# Patient Record
Sex: Female | Born: 1940 | Race: White | Hispanic: No | Marital: Married | Smoking: Never smoker
Health system: Southern US, Community
[De-identification: ages and names within clinical notes are randomized; demographics above are authoritative.]

## PROBLEM LIST (undated history)

## (undated) DIAGNOSIS — I10 Essential (primary) hypertension: Secondary | ICD-10-CM

## (undated) DIAGNOSIS — E039 Hypothyroidism, unspecified: Secondary | ICD-10-CM

## (undated) DIAGNOSIS — M51369 Other intervertebral disc degeneration, lumbar region without mention of lumbar back pain or lower extremity pain: Secondary | ICD-10-CM

## (undated) DIAGNOSIS — M5136 Other intervertebral disc degeneration, lumbar region: Secondary | ICD-10-CM

## (undated) DIAGNOSIS — G8929 Other chronic pain: Secondary | ICD-10-CM

## (undated) DIAGNOSIS — Z86718 Personal history of other venous thrombosis and embolism: Secondary | ICD-10-CM

## (undated) DIAGNOSIS — M810 Age-related osteoporosis without current pathological fracture: Secondary | ICD-10-CM

## (undated) HISTORY — PX: BACK SURGERY: SHX140

## (undated) HISTORY — DX: Hypothyroidism, unspecified: E03.9

## (undated) HISTORY — DX: Age-related osteoporosis without current pathological fracture: M81.0

## (undated) HISTORY — PX: THROMBECTOMY W/ EMBOLECTOMY: SHX2507

## (undated) HISTORY — PX: OTHER SURGICAL HISTORY: SHX169

---

## 1999-01-27 ENCOUNTER — Other Ambulatory Visit: Admission: RE | Admit: 1999-01-27 | Discharge: 1999-01-27 | Payer: Self-pay | Admitting: Internal Medicine

## 1999-06-22 ENCOUNTER — Ambulatory Visit (HOSPITAL_COMMUNITY): Admission: RE | Admit: 1999-06-22 | Discharge: 1999-06-22 | Payer: Self-pay | Admitting: Gastroenterology

## 1999-06-22 ENCOUNTER — Encounter: Payer: Self-pay | Admitting: Gastroenterology

## 1999-07-29 ENCOUNTER — Encounter: Admission: RE | Admit: 1999-07-29 | Discharge: 1999-07-29 | Payer: Self-pay | Admitting: *Deleted

## 1999-07-29 ENCOUNTER — Encounter: Payer: Self-pay | Admitting: *Deleted

## 1999-08-03 ENCOUNTER — Inpatient Hospital Stay (HOSPITAL_COMMUNITY): Admission: EM | Admit: 1999-08-03 | Discharge: 1999-08-07 | Payer: Self-pay | Admitting: Emergency Medicine

## 1999-08-03 ENCOUNTER — Encounter: Payer: Self-pay | Admitting: Internal Medicine

## 1999-08-03 ENCOUNTER — Encounter: Payer: Self-pay | Admitting: Emergency Medicine

## 1999-09-03 ENCOUNTER — Encounter: Admission: RE | Admit: 1999-09-03 | Discharge: 1999-09-03 | Payer: Self-pay | Admitting: *Deleted

## 1999-09-03 ENCOUNTER — Encounter: Payer: Self-pay | Admitting: *Deleted

## 2000-03-31 ENCOUNTER — Encounter: Admission: RE | Admit: 2000-03-31 | Discharge: 2000-03-31 | Payer: Self-pay | Admitting: Internal Medicine

## 2000-03-31 ENCOUNTER — Encounter: Payer: Self-pay | Admitting: Internal Medicine

## 2000-11-16 ENCOUNTER — Encounter: Payer: Self-pay | Admitting: Internal Medicine

## 2000-11-16 ENCOUNTER — Encounter: Admission: RE | Admit: 2000-11-16 | Discharge: 2000-11-16 | Payer: Self-pay | Admitting: Internal Medicine

## 2000-11-17 ENCOUNTER — Encounter: Admission: RE | Admit: 2000-11-17 | Discharge: 2000-11-17 | Payer: Self-pay | Admitting: Internal Medicine

## 2000-11-17 ENCOUNTER — Encounter: Payer: Self-pay | Admitting: Internal Medicine

## 2001-01-08 ENCOUNTER — Encounter (INDEPENDENT_AMBULATORY_CARE_PROVIDER_SITE_OTHER): Payer: Self-pay

## 2001-01-08 ENCOUNTER — Other Ambulatory Visit: Admission: RE | Admit: 2001-01-08 | Discharge: 2001-01-08 | Payer: Self-pay | Admitting: General Surgery

## 2002-05-14 ENCOUNTER — Encounter: Admission: RE | Admit: 2002-05-14 | Discharge: 2002-05-14 | Payer: Self-pay | Admitting: Internal Medicine

## 2002-05-14 ENCOUNTER — Encounter: Payer: Self-pay | Admitting: Internal Medicine

## 2002-05-29 ENCOUNTER — Encounter: Payer: Self-pay | Admitting: Internal Medicine

## 2002-05-29 ENCOUNTER — Ambulatory Visit (HOSPITAL_COMMUNITY): Admission: RE | Admit: 2002-05-29 | Discharge: 2002-05-29 | Payer: Self-pay | Admitting: Internal Medicine

## 2002-06-10 ENCOUNTER — Encounter: Payer: Self-pay | Admitting: Internal Medicine

## 2002-06-10 ENCOUNTER — Ambulatory Visit (HOSPITAL_COMMUNITY): Admission: RE | Admit: 2002-06-10 | Discharge: 2002-06-10 | Payer: Self-pay | Admitting: Internal Medicine

## 2002-06-24 ENCOUNTER — Ambulatory Visit (HOSPITAL_COMMUNITY): Admission: RE | Admit: 2002-06-24 | Discharge: 2002-06-24 | Payer: Self-pay | Admitting: Internal Medicine

## 2002-06-24 ENCOUNTER — Encounter (INDEPENDENT_AMBULATORY_CARE_PROVIDER_SITE_OTHER): Payer: Self-pay | Admitting: Specialist

## 2002-06-24 ENCOUNTER — Encounter: Payer: Self-pay | Admitting: Internal Medicine

## 2002-07-22 ENCOUNTER — Encounter: Payer: Self-pay | Admitting: General Surgery

## 2002-07-24 ENCOUNTER — Encounter (INDEPENDENT_AMBULATORY_CARE_PROVIDER_SITE_OTHER): Payer: Self-pay | Admitting: *Deleted

## 2002-07-24 ENCOUNTER — Ambulatory Visit (HOSPITAL_COMMUNITY): Admission: RE | Admit: 2002-07-24 | Discharge: 2002-07-25 | Payer: Self-pay | Admitting: General Surgery

## 2003-07-29 ENCOUNTER — Other Ambulatory Visit: Admission: RE | Admit: 2003-07-29 | Discharge: 2003-07-29 | Payer: Self-pay | Admitting: Obstetrics and Gynecology

## 2003-12-18 ENCOUNTER — Encounter: Admission: RE | Admit: 2003-12-18 | Discharge: 2003-12-18 | Payer: Self-pay | Admitting: Internal Medicine

## 2004-05-31 ENCOUNTER — Encounter: Admission: RE | Admit: 2004-05-31 | Discharge: 2004-05-31 | Payer: Self-pay | Admitting: Internal Medicine

## 2004-09-06 ENCOUNTER — Ambulatory Visit (HOSPITAL_BASED_OUTPATIENT_CLINIC_OR_DEPARTMENT_OTHER): Admission: RE | Admit: 2004-09-06 | Discharge: 2004-09-06 | Payer: Self-pay | Admitting: Otolaryngology

## 2004-09-06 ENCOUNTER — Encounter (INDEPENDENT_AMBULATORY_CARE_PROVIDER_SITE_OTHER): Payer: Self-pay | Admitting: Specialist

## 2004-09-06 ENCOUNTER — Ambulatory Visit (HOSPITAL_COMMUNITY): Admission: RE | Admit: 2004-09-06 | Discharge: 2004-09-06 | Payer: Self-pay | Admitting: Otolaryngology

## 2004-09-10 ENCOUNTER — Emergency Department (HOSPITAL_COMMUNITY): Admission: EM | Admit: 2004-09-10 | Discharge: 2004-09-10 | Payer: Self-pay

## 2004-12-14 ENCOUNTER — Other Ambulatory Visit: Admission: RE | Admit: 2004-12-14 | Discharge: 2004-12-14 | Payer: Self-pay | Admitting: Obstetrics and Gynecology

## 2005-10-31 ENCOUNTER — Encounter: Admission: RE | Admit: 2005-10-31 | Discharge: 2005-10-31 | Payer: Self-pay | Admitting: Obstetrics and Gynecology

## 2006-12-19 ENCOUNTER — Encounter: Admission: RE | Admit: 2006-12-19 | Discharge: 2006-12-19 | Payer: Self-pay | Admitting: Internal Medicine

## 2007-01-17 ENCOUNTER — Encounter: Admission: RE | Admit: 2007-01-17 | Discharge: 2007-01-17 | Payer: Self-pay | Admitting: Internal Medicine

## 2007-09-04 ENCOUNTER — Other Ambulatory Visit: Admission: RE | Admit: 2007-09-04 | Discharge: 2007-09-04 | Payer: Self-pay | Admitting: Obstetrics and Gynecology

## 2007-10-29 ENCOUNTER — Encounter: Admission: RE | Admit: 2007-10-29 | Discharge: 2007-10-29 | Payer: Self-pay | Admitting: Obstetrics and Gynecology

## 2008-08-13 ENCOUNTER — Emergency Department (HOSPITAL_COMMUNITY): Admission: EM | Admit: 2008-08-13 | Discharge: 2008-08-13 | Payer: Self-pay | Admitting: Emergency Medicine

## 2008-08-14 ENCOUNTER — Inpatient Hospital Stay (HOSPITAL_COMMUNITY): Admission: EM | Admit: 2008-08-14 | Discharge: 2008-08-22 | Payer: Self-pay | Admitting: Emergency Medicine

## 2008-08-27 ENCOUNTER — Inpatient Hospital Stay (HOSPITAL_COMMUNITY): Admission: AD | Admit: 2008-08-27 | Discharge: 2008-09-12 | Payer: Self-pay | Admitting: Neurosurgery

## 2008-08-29 ENCOUNTER — Ambulatory Visit: Payer: Self-pay | Admitting: Surgery

## 2008-08-29 ENCOUNTER — Encounter (INDEPENDENT_AMBULATORY_CARE_PROVIDER_SITE_OTHER): Payer: Self-pay | Admitting: Neurosurgery

## 2008-09-29 ENCOUNTER — Inpatient Hospital Stay (HOSPITAL_COMMUNITY): Admission: EM | Admit: 2008-09-29 | Discharge: 2008-10-20 | Payer: Self-pay | Admitting: Emergency Medicine

## 2008-10-10 ENCOUNTER — Encounter (INDEPENDENT_AMBULATORY_CARE_PROVIDER_SITE_OTHER): Payer: Self-pay | Admitting: Neurosurgery

## 2008-10-14 ENCOUNTER — Ambulatory Visit: Payer: Self-pay | Admitting: Physical Medicine & Rehabilitation

## 2008-10-16 ENCOUNTER — Ambulatory Visit: Payer: Self-pay | Admitting: Surgery

## 2008-10-16 ENCOUNTER — Encounter (INDEPENDENT_AMBULATORY_CARE_PROVIDER_SITE_OTHER): Payer: Self-pay | Admitting: Neurosurgery

## 2008-11-21 ENCOUNTER — Emergency Department (HOSPITAL_COMMUNITY): Admission: EM | Admit: 2008-11-21 | Discharge: 2008-11-22 | Payer: Self-pay | Admitting: Emergency Medicine

## 2008-11-21 ENCOUNTER — Ambulatory Visit: Payer: Self-pay | Admitting: Internal Medicine

## 2008-12-29 ENCOUNTER — Encounter: Admission: RE | Admit: 2008-12-29 | Discharge: 2009-01-12 | Payer: Self-pay | Admitting: Neurosurgery

## 2009-01-21 IMAGING — CR DG THORACIC SPINE 2V
1 series · 1 of 1 positions shown · non-contrast
Comparison: Thoracolumbar MRI 09/29/2008

CLINICAL DATA: T11 thoracic laminectomy

THORACIC SPINE - 2 VIEW

[view not recorded]
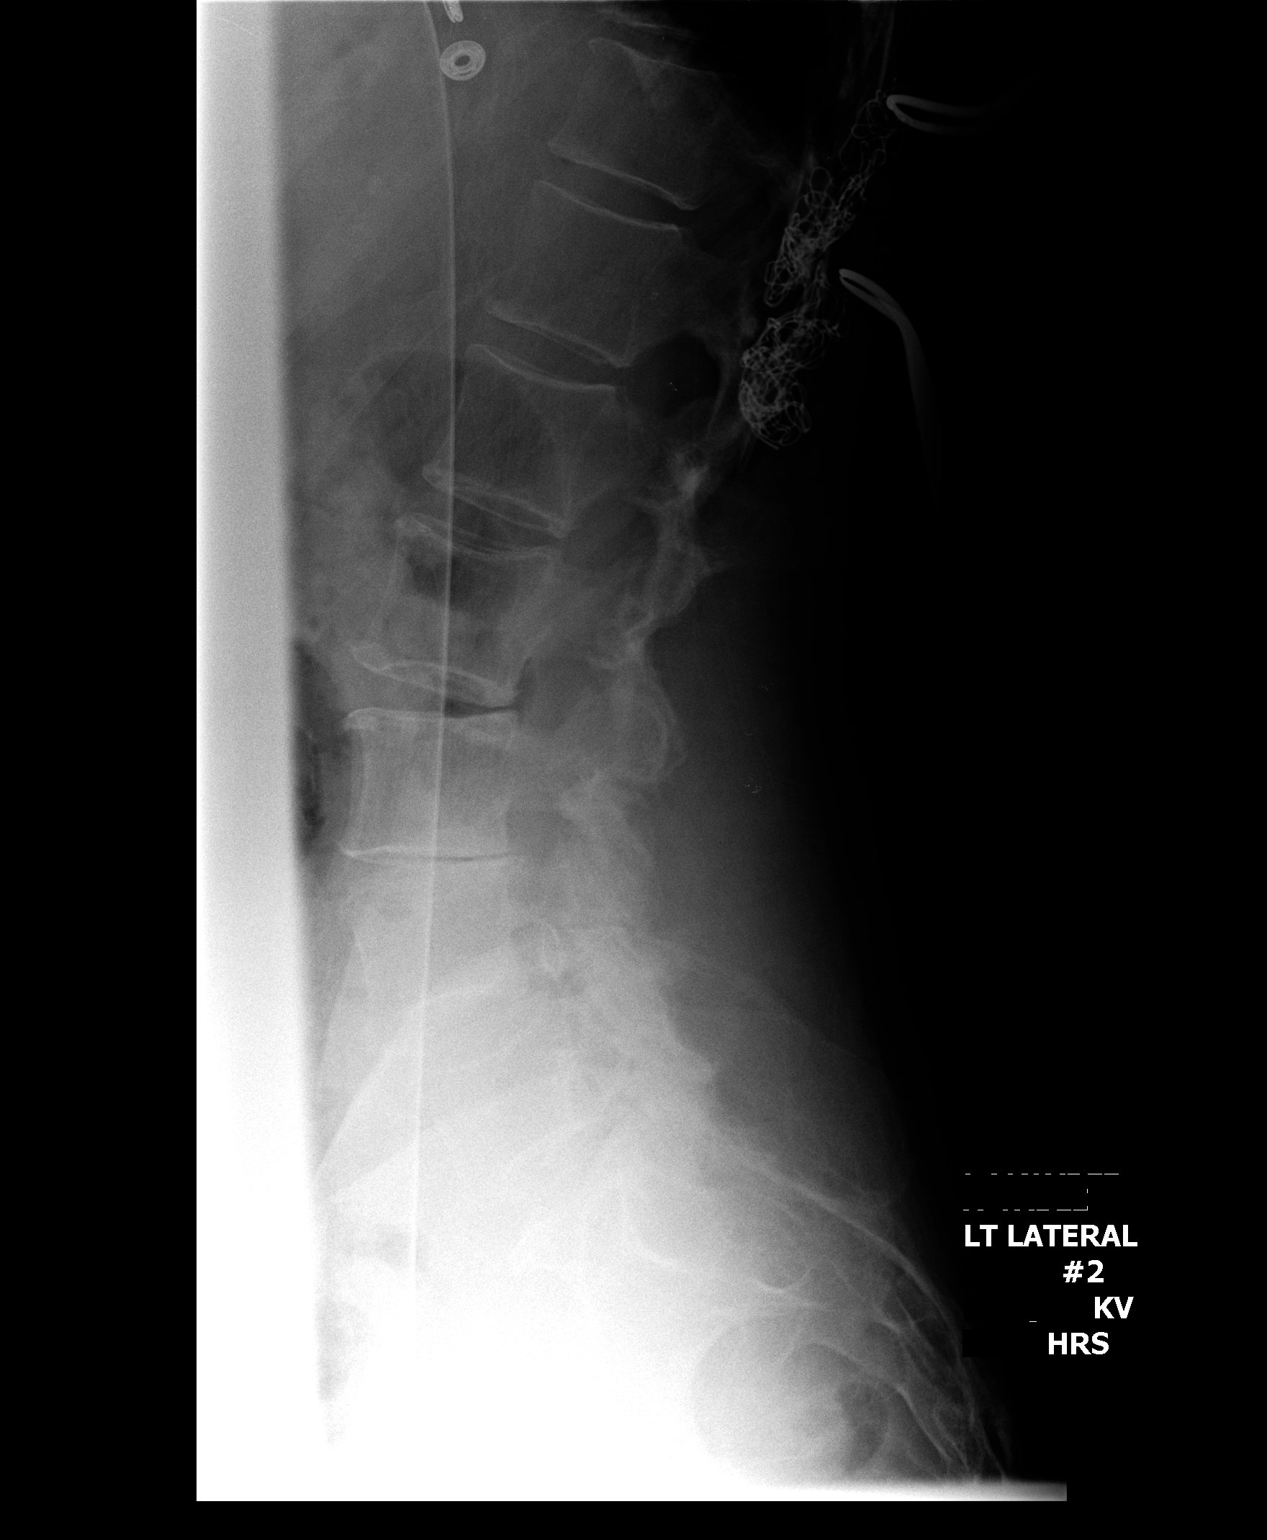

[1 of 1 positions shown; findings below may reference images not displayed]

FINDINGS: Numbering is as per prior MRI.  On the initial image at
[DATE] p.m., a radiopaque marker is noted posterior to the T12-L1
interspace.  The second image obtained at [DATE] p.m. demonstrates
cerclage material and radiopaque markers posterior to the T10-T11
and T11-T12 interspaces, respectively.  The third image obtained at
[DATE] p.m. demonstrates radiopaque markers posterior to the inferior
aspect of the T12 vertebral body and posterior to the T11 vertebral
body, respectively.
IMPRESSION: Intraoperative localization images as above.

## 2009-01-27 IMAGING — XA IR IVC FILTER PLACEMENT
1 series · 13 of 24 positions shown · non-contrast
Comparison: none

INDICATION: Recent spinal surgery with lower extremity DVT.

[Series 1: run · 13 of 28 slices shown]
[im 1/28]
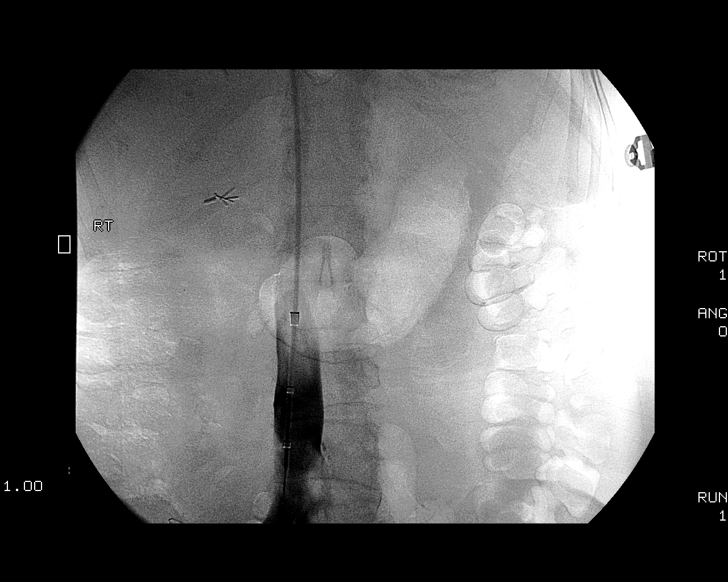
[im 3/28]
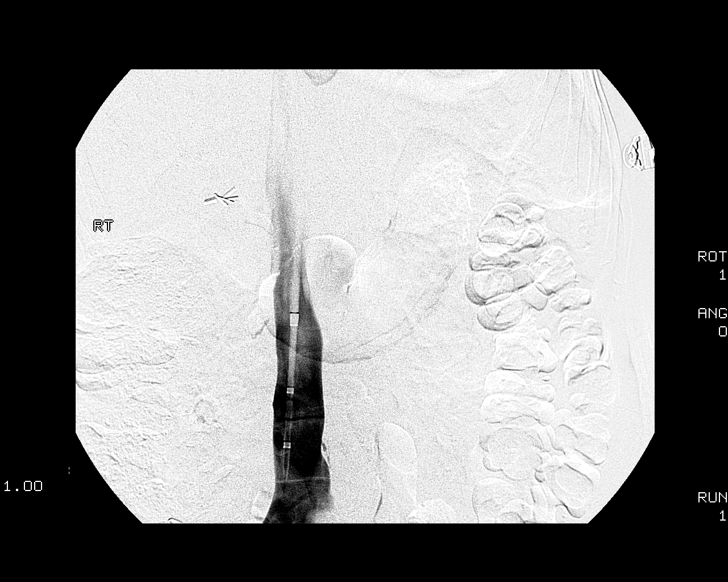
[im 5/28]
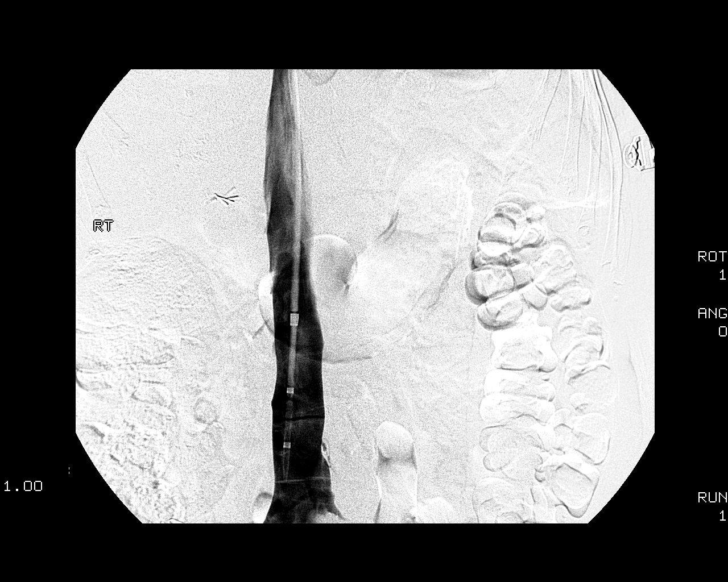
[im 8/28]
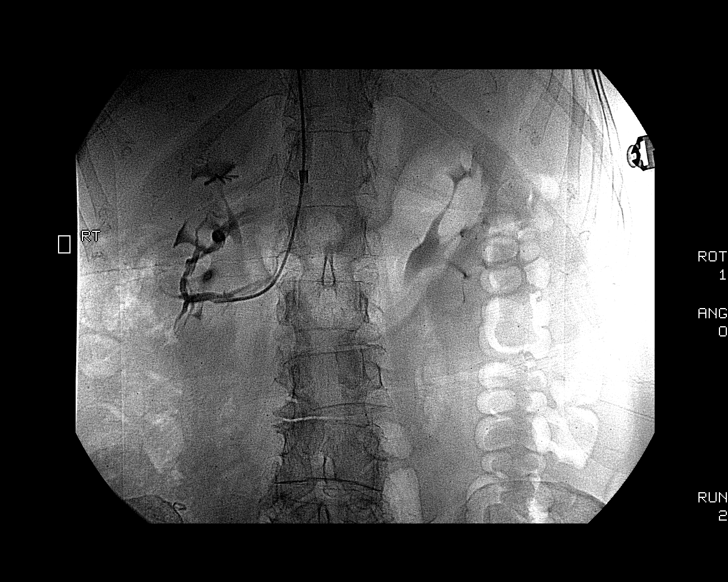
[im 10/28]
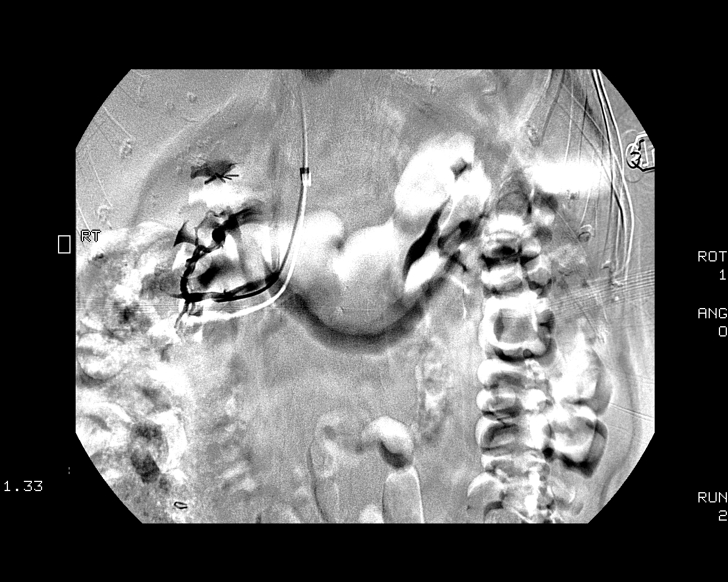
[im 12/28]
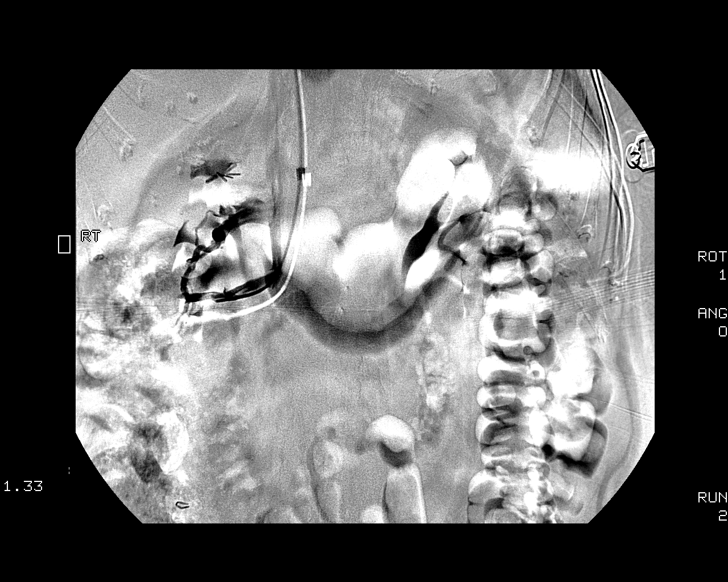
[im 15/28]
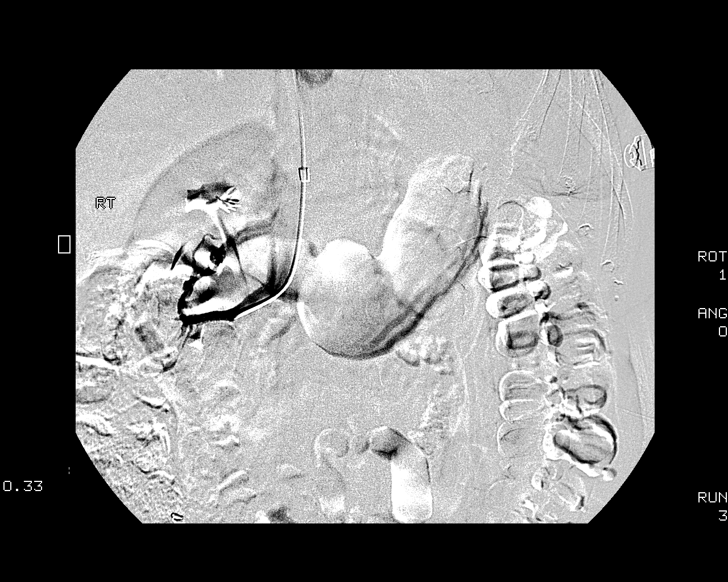
[im 16/28]
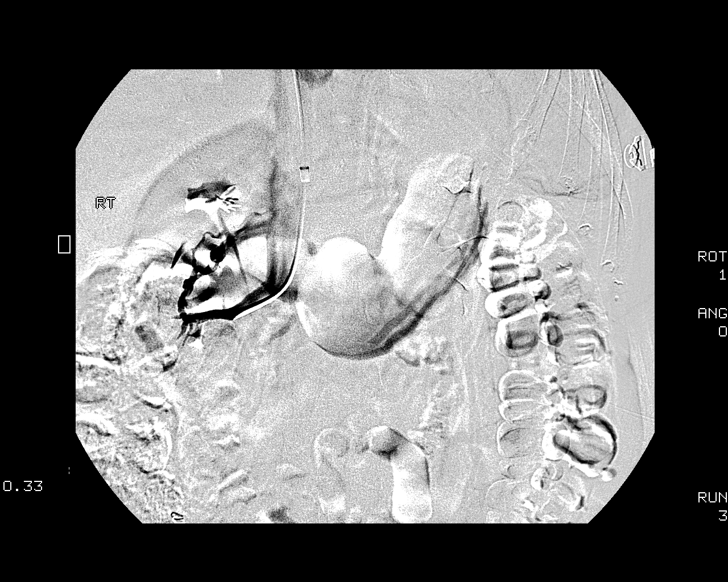
[im 18/28]
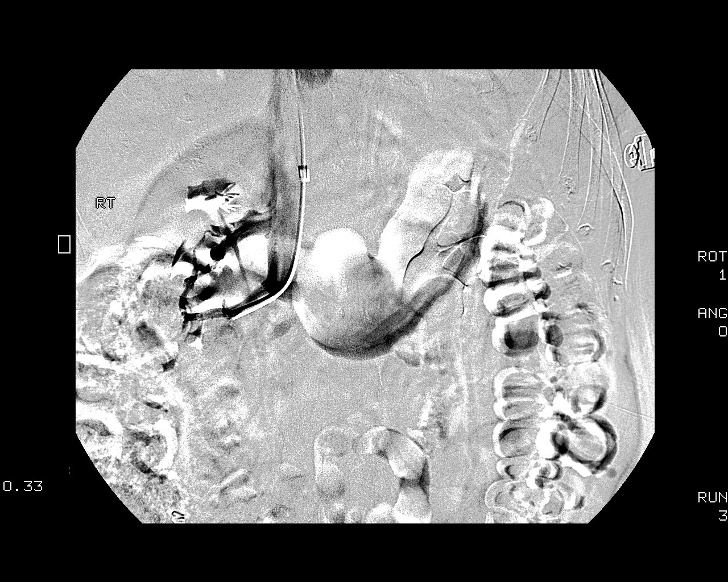
[im 20/28]
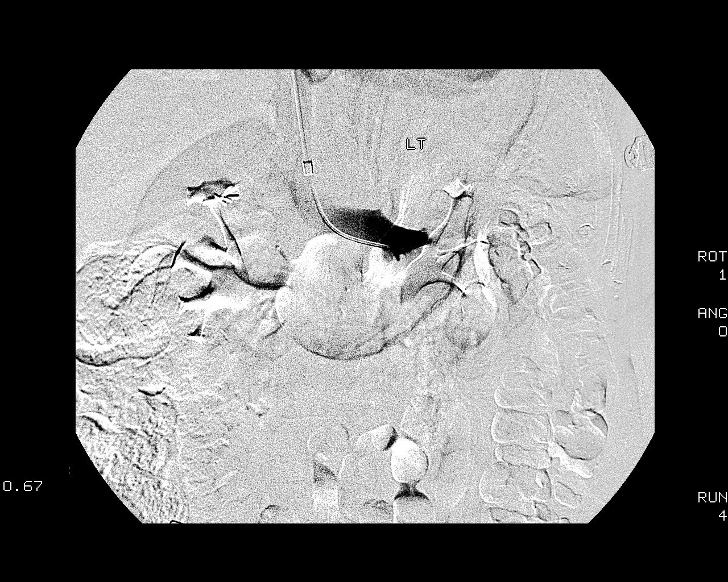
[im 23/28]
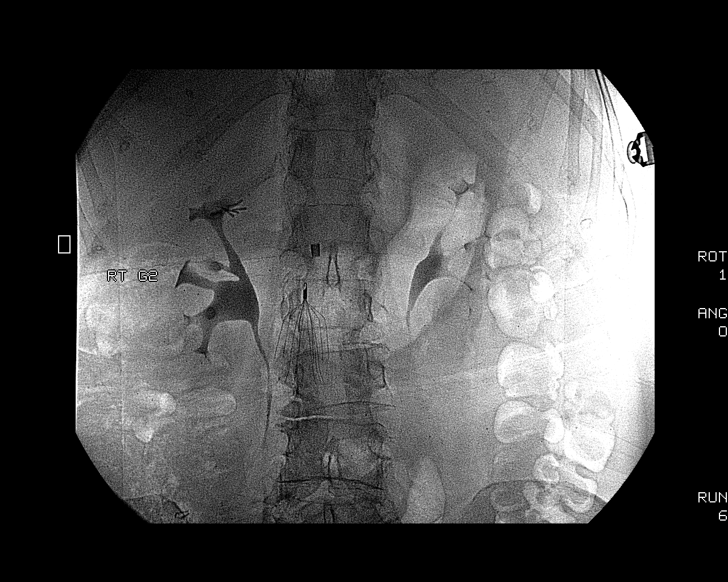
[im 25/28]
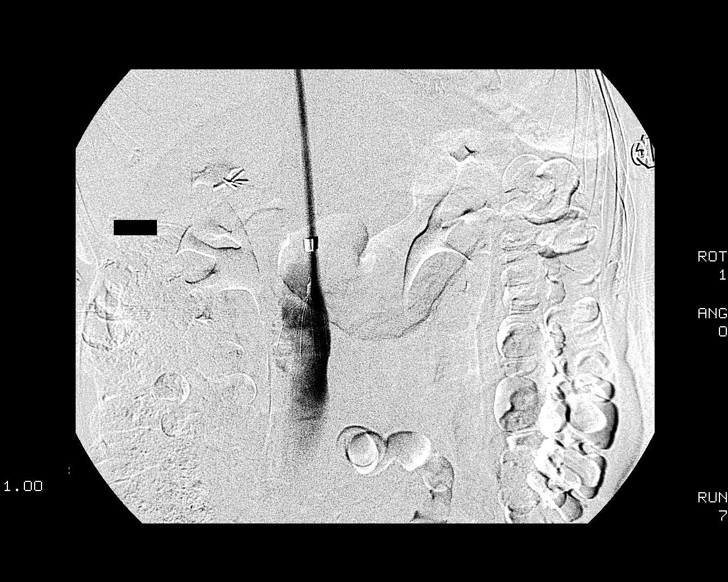
[im 28/28]
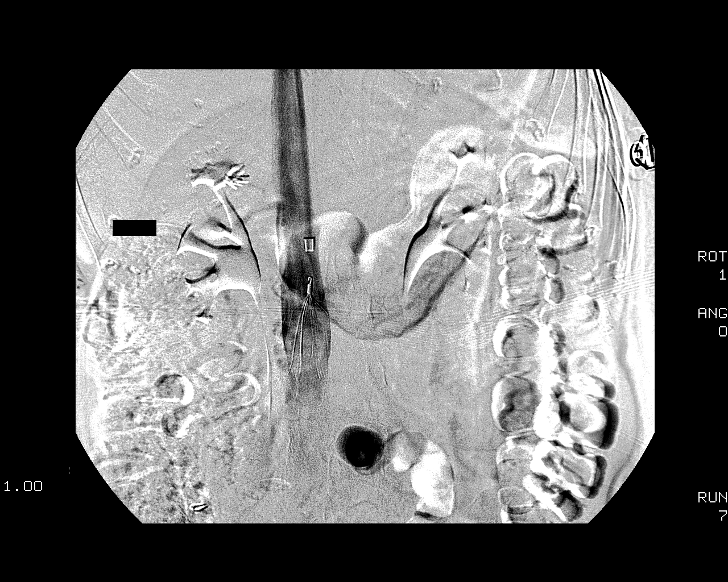

[13 of 24 positions shown; findings below may reference images not displayed]

PROCEDURE(S): IVC FILTER PLACEMENT; IVC VENOGRAM; ULTRASOUND FOR
VASCULAR ACCESS

Medications: Versed 0.5 mg, Fentanyl 12.5 mcg

Sedation time: 25 minutes

Fluoroscopy time: 4.8 minutes

Procedure:Informed consent was obtained for an IVC venogram and
filter placement.  Ultrasound demonstrated a patent right internal
jugular vein.  Ultrasound images were obtained for documentation.
The right neck was prepped and draped in a sterile fashion.  The
skin was anesthetized with 1% lidocaine.  A 21 gauge needle was
directed into the vein with ultrasound guidance and a micropuncture
dilator set was placed.  A wire was advanced into the IVC.  The
filter sheath was advanced over the wire into the IVC.  An IVC
venogram was performed.  Fluoroscopic images were obtained for
documentation.  Bilateral renal veins were selected and selective
venograms were performed.  A G2 X filter was deployed below the
lowest renal vein. A follow-up venogram was performed and the
vascular sheath was removed with manual compression.
FINDINGS: IVC is patent.  Venogram suggested multiple right renal
veins.  Bilateral renal veins were selected with a MPA catheter and
there was evidence for two right renal veins.  The filter was
deployed below the lowest renal vein.  Follow-up venogram confirmed
placement within the IVC and below the renal veins.  The venogram
also suggested clot involving the left iliac vein.
IMPRESSION: Successful placement of a retrievable IVC filter.

Evidence for clot in the left iliac venous system.

## 2009-04-13 ENCOUNTER — Ambulatory Visit (HOSPITAL_COMMUNITY): Admission: RE | Admit: 2009-04-13 | Discharge: 2009-04-13 | Payer: Self-pay | Admitting: Obstetrics and Gynecology

## 2009-06-22 ENCOUNTER — Encounter: Admission: RE | Admit: 2009-06-22 | Discharge: 2009-06-22 | Payer: Self-pay | Admitting: Neurosurgery

## 2010-03-29 ENCOUNTER — Encounter: Admission: RE | Admit: 2010-03-29 | Discharge: 2010-03-29 | Payer: Self-pay | Admitting: Neurosurgery

## 2010-04-19 ENCOUNTER — Encounter: Admission: RE | Admit: 2010-04-19 | Discharge: 2010-04-19 | Payer: Self-pay | Admitting: Neurosurgery

## 2010-10-24 ENCOUNTER — Encounter: Payer: Self-pay | Admitting: Interventional Radiology

## 2010-10-24 ENCOUNTER — Encounter: Payer: Self-pay | Admitting: Obstetrics and Gynecology

## 2010-10-24 ENCOUNTER — Encounter: Payer: Self-pay | Admitting: Neurosurgery

## 2011-01-14 ENCOUNTER — Other Ambulatory Visit: Payer: Self-pay | Admitting: Obstetrics and Gynecology

## 2011-01-14 DIAGNOSIS — Z1231 Encounter for screening mammogram for malignant neoplasm of breast: Secondary | ICD-10-CM

## 2011-01-17 LAB — CROSSMATCH
ABO/RH(D): O POS
Antibody Screen: NEGATIVE

## 2011-01-17 LAB — CBC
HCT: 38.8 % (ref 36.0–46.0)
Hemoglobin: 12.9 g/dL (ref 12.0–15.0)
MCV: 92.1 fL (ref 78.0–100.0)
RDW: 16.7 % — ABNORMAL HIGH (ref 11.5–15.5)

## 2011-01-17 LAB — COMPREHENSIVE METABOLIC PANEL
Alkaline Phosphatase: 77 U/L (ref 39–117)
BUN: 18 mg/dL (ref 6–23)
Glucose, Bld: 208 mg/dL — ABNORMAL HIGH (ref 70–99)
Potassium: 4.7 mEq/L (ref 3.5–5.1)
Total Bilirubin: 0.5 mg/dL (ref 0.3–1.2)
Total Protein: 5.8 g/dL — ABNORMAL LOW (ref 6.0–8.3)

## 2011-01-18 LAB — URINALYSIS, ROUTINE W REFLEX MICROSCOPIC
Hgb urine dipstick: NEGATIVE
Specific Gravity, Urine: 1.016 (ref 1.005–1.030)
Urobilinogen, UA: 1 mg/dL (ref 0.0–1.0)
pH: 7.5 (ref 5.0–8.0)

## 2011-01-18 LAB — DIFFERENTIAL
Eosinophils Absolute: 0.3 10*3/uL (ref 0.0–0.7)
Eosinophils Relative: 5 % (ref 0–5)
Lymphocytes Relative: 27 % (ref 12–46)
Lymphs Abs: 1.3 10*3/uL (ref 0.7–4.0)
Monocytes Absolute: 0.6 10*3/uL (ref 0.1–1.0)
Monocytes Relative: 11 % (ref 3–12)

## 2011-01-18 LAB — URINE MICROSCOPIC-ADD ON

## 2011-01-18 LAB — CBC
HCT: 23.2 % — ABNORMAL LOW (ref 36.0–46.0)
Hemoglobin: 7.9 g/dL — CL (ref 12.0–15.0)
MCV: 88.2 fL (ref 78.0–100.0)
Platelets: 281 10*3/uL (ref 150–400)
RBC: 2.63 MIL/uL — ABNORMAL LOW (ref 3.87–5.11)
WBC: 5 10*3/uL (ref 4.0–10.5)

## 2011-01-18 LAB — BASIC METABOLIC PANEL
CO2: 25 mEq/L (ref 19–32)
GFR calc non Af Amer: 55 mL/min — ABNORMAL LOW (ref >60)

## 2011-01-18 LAB — PROTIME-INR: Prothrombin Time: 43 seconds — ABNORMAL HIGH (ref 11.6–15.2)

## 2011-01-27 ENCOUNTER — Ambulatory Visit
Admission: RE | Admit: 2011-01-27 | Discharge: 2011-01-27 | Disposition: A | Discharge status: Home / Self Care | Payer: MEDICARE | Source: Ambulatory Visit | Attending: Obstetrics and Gynecology | Admitting: Obstetrics and Gynecology

## 2011-01-27 DIAGNOSIS — Z1231 Encounter for screening mammogram for malignant neoplasm of breast: Secondary | ICD-10-CM
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
## 2011-02-15 NOTE — Discharge Summary (Signed)
NAMEJAIDIN, UGARTE                ACCOUNT NO.:  [id number]   MEDICAL RECORD NO.:  [id number]          PATIENT TYPE:  INP   LOCATION:  3030                         FACILITY:  [facility]   PHYSICIAN:  Hilda Lias, M.D.   DATE OF BIRTH:  1941-03-22   DATE OF ADMISSION:  08/27/2008  DATE OF DISCHARGE:  09/12/2008                               DISCHARGE SUMMARY   ADMISSION DIAGNOSES:  1. Spinal cord bleeding.  2. Sensory neuropathy.   FINAL DIAGNOSES:  1. Spinal cord bleeding.  2. Sensory neuropathy.   CLINICAL HISTORY:  Ms. Cazarez is a lady who underwent emergency surgery  because of hematoma in the spinal cord.  X-rays were negative.  The  patient was discharged and she went home, 2 days later she called my  partner complaining of burning pain in the right leg.  She was  readmitted and she underwent further angiogram, which was negative.  She  was kept for pain control.   LABORATORY:  Normal.   COURSE IN THE HOSPITAL:  The patient has had a second spinal angiogram,  which was negative.  MRI was unremarkable.  The patient was given  medications for pain and today she is feeling much better.  She has no  weakness prior to surgery.  Today, she is ready to go home.   CONDITION ON DISCHARGE:  Stable with improvement of the pain.   MEDICATIONS:  She will be taking Lyrica, fentanyl patch, Percocet, and  diazepam.   DIET:  Regular.   ACTIVITY:  Not to drive.   FOLLOWUP:  Will be seen in my office in 2-3 weeks.  The plan is for her  to have an MRI 2 months from now with and without contrast and based on  the result, we will repeat this spinal angiogram.  Any questions, she is  to call us any time.           ______________________________  Hilda Lias, M.D.     EB/MEDQ  D:  09/12/2008  T:  09/12/2008  Job:  [id number]

## 2011-02-15 NOTE — H&P (Signed)
Meghan, Gross                ACCOUNT NO.:  [id number]   MEDICAL RECORD NO.:  [id number]          PATIENT TYPE:  INP   LOCATION:  3030                         FACILITY:  [facility]   PHYSICIAN:  Danae Orleans. Venetia Maxon, M.D.  DATE OF BIRTH:  [birth year]   DATE OF ADMISSION:  08/27/2008  DATE OF DISCHARGE:                              HISTORY & PHYSICAL   This is an interval admission dictations on [name] who is a  patient of Dr. Jeral Fruit, on whom he performed a decompressive surgery for  spinal subarachnoid and intradural hemorrhage.   She is currently a 70 year old woman who was seen in the emergency room  on August 14, 2008, and subsequently taken, after surgery the same day  for an evacuation of intradural subarachnoid and subdural hematoma of  her spinal canal with her sudden onset of right lower extremity  paralysis.  She was discharged home on August 22, 2008, with improved  pain levels and function.  Over the weekend, her pain increased to the  present level of intractable low back and right lower extremity pain.  Her right lower extremity weakness has also recurred.  She comes into  the office today as Dr. Jeral Fruit is out of the country and on my  examination in the office today, she appears to be significantly  uncomfortable.  She complains of right leg pain and weakness and says  this is worsening compared to the time of discharge.  The plan based on  her severity of her pain would be admission to the 3000 unit with  emergent MRI of her thoracic and lumbar spine to rule out recurrent  hemorrhage.  She also has some right calf tenderness and I will obtain  bilateral lower extremity Doppler to rule out deep venous thrombosis.   PHYSICAL EXAMINATION:  VITAL SIGNS:  Blood pressure is 102/70, heart  rate is 68, and no auscultated murmurs, respiratory rate is 24 and  unlabored.  Temperature is 97.9 orally.  LUNGS:  Sounds are clear.  GENERAL:  She is a well developed 70 year old  woman, unable to sit  comfortably in a wheelchair.  HEAD:  Normocephalic, atraumatic.  EYES:  Pupils are equal, round, and reactive to light.  Extraocular  muscles intact.  NECK:  Supple.  ABDOMEN:  She has active bowel sounds in all 4 quadrants.  No  tenderness.  EXTREMITIES:  Without edema, clubbing, or cyanosis.  Her right lower  extremity pain is severe and on examination, she has right hip flexor  weakness, mild right hip extensor weakness.  Plantar flexion and  dorsiflexion are actually quite good in her lower extremity.  She is not  able to walk with assistance.  She is awake, alert, and conversant.  SKIN:  Appears to have normal color.   PAST MEDICAL HISTORY:  Significant for gastric __________ examination.  She does not note any perineal numbness.  She denies any bowel or  bladder dysfunction.  She has intact reflexes in the lower extremities,  which shows down going great toes.  Past medical history is significant  for gastric reflux, hypothyroidism, osteoarthritis.  PAST SURGICAL HISTORY:  Significant for appendectomy, cholecystectomy,  laminectomy.   SOCIAL HISTORY:  She denies tobacco or illicit drug use.   ALLERGIES:  AMOXICILLIN.   HOME MEDICATIONS:  1. Nexium 40 mg daily.  2. __________ mg daily.  3. Nasacort p.r.n.  4. Diovan 160 mg daily.  5. Gabapentin 300 mg 3 times a day.  6. Xanax __________mg every 6 hours as needed for multi-spasm.  7. Oxycodone 15 mg every 3 hours as needed for pain.   PLAN:  The patient to be admitted to the 3000 unit for increased low  back and right lower extremity pain.  She is to get an urgent MRI of her  thoracic and lumbar spine with and without contrast tonight.  She is to  get Dopplers of both lower extremities.  She will be started on morphine  PCA for pain control.  She will be getting admit labs of CBC with  platelets, CMET, coags, PT, and PTT.      Danae Orleans. Venetia Maxon, M.D.  Electronically Signed     JDS/MEDQ  D:   08/27/2008  T:  08/28/2008  Job:  [id number]

## 2011-02-15 NOTE — Consult Note (Signed)
NAMEBEAU, VANDUZER                ACCOUNT NO.:  [id number]   MEDICAL RECORD NO.:  [id number]          PATIENT TYPE:  EMS   LOCATION:  MAJO                         FACILITY:  [facility]   PHYSICIAN:  Gordy Savers, MDDATE OF BIRTH:  1941/05/27   DATE OF CONSULTATION:  DATE OF DISCHARGE:  11/22/2008                                 CONSULTATION   CHIEF COMPLAINT:  Worsening left leg pain.   HISTORY OF PRESENT ILLNESS:  The patient is a 70 year old female with a  very complex past medical history.  She was admitted to the hospital in  November of 2009 for a lumbar subdural hematoma.  She presented with  paraparesis at that time.  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  Following this procedure, she suffered a deep vein thrombosis and  subsequently had an IVC filter placed due to the recent neurosurgical  procedure.  More recently she has been evaluated at [facility] by  both Hematology and Vascular Surgery.  On November 14, 2008, she  underwent surgery for removal of clot burden and apparently an iliac  stent placed.  Over the past 7 or 8 days, she has noted little change in  her bilateral lower extremity edema, the left leg much greater than the  right.  She was also told on February 12 that she had significant anemia  and there was a question of whether to transfuse her at that time.  Today, she developed worsening pain mainly on the left groin, but also  involving the entire leg.  She has been on chronic analgesics at home  that were not very effective.  In the ED setting, she was given a single  dose of Dilaudid with much improvement.  She was also seen in the office  of her primary care physician on November 21, 2008, where she was noted  to have a slightly elevated INR and her Coumadin dose adjusted.  INR  today was 4.1.  CBC revealed a white count of 5.0, hemoglobin 7.9,  hematocrit 23.2, MCV was 88.   On October 10, 2008,  H&H was 12.9 and  38.8, INR earlier yesterday was 4.5 and her Coumadin dose has been  adjusted and she has been instructed to hold her medications for 48  hours.   PHYSICAL EXAMINATION:  GENERAL:  A well-developed, mildly overweight  female who is anxious, but was in no apparent distress.  She was alert  and oriented.  SKIN:  Unremarkable except for slight paleness.  VITAL SIGNS:  Stable, blood pressure was 150/72, pulse rate ranged from  64-73.  HEAD AND NECK:  Normal pupil responses.  Conjunctivae clear.  ENT  normal.  NECK:  No bruits or neck vein distention.  CHEST:  Clear.  CARDIOVASCULAR:  Normal S1 and S2.  Rare ectopics noted.  There is no  tachycardia.  ABDOMEN:  Soft, nontender.  No organomegaly.  LOWER EXTREMITIES:  Considerable bilateral lower extremity edema,  especially involving the left leg.  Peripheral pulses were full.  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   ADDITIONAL DIAGNOSES:  1. Anemia.  This appears chronic and unchanged compared to 8 days ago      at which time a blood transfusion was considered in [place].  2. Left leg pain.  The patient is clinically improved at this time.      Options were discussed including hospital admission and she wishes      to return home and try oral medication.  She does have OxyIR 15 mg      from her previous prescription from earlier surgery.  She will use      this in replacement of her Percocet.   DISPOSITION:  The patient was discharged to continue her present regimen  with the exception of OxyIR 15 will be substituted for her Percocet.  She will be maintained on her Coumadin.  She has been asked to follow up  with her primary care Janeene Sand in 2 days and  return to the ED of pain  intensifies.      Gordy Savers, MD  Electronically Signed     PFK/MEDQ  D:  11/22/2008  T:  11/22/2008  Job:  [id number]

## 2011-02-15 NOTE — Discharge Summary (Signed)
[name], [name]                ACCOUNT NO.:  [id number]   MEDICAL RECORD NO.:  [id number]          PATIENT TYPE:  INP   LOCATION:  3015                         FACILITY:  [facility]   PHYSICIAN:  Hilda Lias, M.D.   DATE OF BIRTH:  09-15-1941   DATE OF ADMISSION:  08/14/2008  DATE OF DISCHARGE:  08/22/2008                               DISCHARGE SUMMARY   ADMISSION DIAGNOSIS:  Sudden onset of paralysis of the right leg with  thoracic and lumbar subdural hematoma.   FINAL DIAGNOSIS:  Sudden onset of paralysis of the right leg with  thoracic and lumbar subdural hematoma.   CLINICAL HISTORY:  [name] is a lady who had been complaining of back  and right leg pain.  She was seen in the emergency room on the morning  of August 14, 2008.  We were called about late in the afternoon, at  that time, I found that she had dense paralysis in the right leg.  MRI  of the thoracic and lumbar area show subdural hematoma.  Because of the  finding, she was admitted to the hospital.  Laboratory normal.   HOSPITAL COURSE:  The patient was taken immediately to Surgery, had  bilateral L2-L3 laminectomy with evacuation of subdural hematoma was  done.  At the present time, she is doing much better.  The weakness has  improved, but she has some residual burning sensation in the right leg.  She is being discharged today to be followed by me in my office.  She  had physical and occupational therapy.   CONDITION ON DISCHARGE:  Improving.   MEDICATIONS:  Percocet, diazepam, and Neurontin.   DIET:  Regular.   ACTIVITY:  She is going to be followed by the Physical Therapy at home.   FOLLOWUP:  We will be seeing her in 4 weeks, otherwise, at the hospital  and can call anytime.   In addition, the patient after surgery, 72-hour later, she had a  thoracic angiogram which showed no evidence of finding any lesion.  The  plan is to repeat the MRI of the thoracic and lumbar area in 6-8 weeks  and hopefully  repeat the thoracic angiogram in about 3 months from now.           ______________________________  Hilda Lias, M.D.     EB/MEDQ  D:  08/22/2008  T:  08/23/2008  Job:  [id number]   cc:   Theressa Millard, M.D.

## 2011-02-15 NOTE — Op Note (Signed)
[name], HOLSTAD                ACCOUNT NO.:  [id number]   MEDICAL RECORD NO.:  [id number]          PATIENT TYPE:  INP   LOCATION:  3114                         FACILITY:  [facility]   PHYSICIAN:  Hilda Lias, M.D.   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   POSTOPERATIVE DIAGNOSIS:  Resection of arteriovenous malformation.   PROCEDURE:  T10-T11 laminectomy, lysis of adhesions, removal of  dorsolateral arteriovenous malformation compromising the spinal cord and  the takeoff thoracic 11 nerve root under microscope.   SURGEON:  Hilda Lias, MD   ASSISTANT:  Cristi Loron, MD.   CLINICAL HISTORY:  Ms. Dace is a lady who was admitted to the emergency  room several weeks ago because of the onset of back pain.  By the time I  saw her, she had dense weakness in both legs.  We found hematoma in the  thoracolumbar area.  The x-rays did not show the source of the bleeding.  Because of the severe weakness, she was taken to Surgery and  decompression was achieved.  The patient did improve strength wise, but  4 days later she developed quite a bit of pain going to the right side  of the leg which had been reluctant to every single conservative  treatment.  The patient had several workups including angiogram twice,  negative and an MRI, the first two was not quite accurate.  The patient  was discharged.  She was readmitted on September 29, 2008, by Dr. Coletta Memos because of the same pain.  At this time, an MRI which was almost  5 weeks after the previous bleeding episode showed a lesion at the level  of thoracic 11 on the right side, most likely angioma.  Surgery was  advised.  The family wanted to have another approach such as radiation  therapy, but evaluation done by the radiation therapist was against  radiation and they advised surgery.   The surgery was fully explained to  the family including the possibility of not finding of the lesion and  paralysis.   PROCEDURE:  The patient was taken to the OR and she was positioned in  prone manner.  The skin was cleaned with DuraPrep.  X-ray showed that  indeed we were at the level of T12.  Then, a midline incision from T10-  T12 was made.  Medial muscle was retracted all the way laterally.  We  proceeded with laminectomy of T11, part of T12, and the lower part of  T10.  The laminectomy was done and we were able to visualize the spinal  cord at its length.  Then with the microscope, we opened the dura mater.  Immediately, there was no evidence of any CSF fluid coming through the  wound.  The patient has quite a bit of thick arachnoid.  Lysis was  achieved and there was some area where the CSF was loculated and with  clear fluid.  The area around  the spinal cord as well as the area around  the dura mater in the medial aspect showed decoloration going from  greenish to yellowish.  This went along with the previous bleeding.  Then, after we did a quite a bit of lysis of adhesion, we were able to  decompress the spinal cord and cleaned and drained CSF.  We started  investigating the dorsolateral aspect of the spinal cord.  There was no  tumor itself.  On the right at T11, there was a nidus of arteriovenous  malformation.  The malformation was pushing the DREZ of the thoracic 11  nerve root and there was concavity into the spinal cord itself.  Lysis  was accomplished.  We started removing the arterial input of the  malformation, and then we started dissecting away from the spinal cord  until the resection of the AVM was achieved.  The AVM was probably  around 5-6 mm.  The main drain was coming dorsolaterally in the spinal  cord and there was some going into the nerve root itself.  At the end,  we had a good removal of the venous malformation.  Indeed, we can see  right at the level of  the entry of the thoracic 11 nerve root, that was  the area where the malformation was done.  The spinal cord at that level  was a little bit concave.  Having done good hemostasis and having  achieved good resection, the area was irrigated.  The dura mater was  closed with 4-0 Nurolon.  Tisseel was left in the dural space and the  wound was closed with Vicryl and Steri-Strips.  The patient was going to  go to the Intensive Care Unit.  At the end of the procedure, the patient  was awake and moving both lower extremities.           ______________________________  Hilda Lias, M.D.     EB/MEDQ  D:  10/10/2008  T:  10/11/2008  Job:  [id number]

## 2011-02-15 NOTE — H&P (Signed)
[name]                ACCOUNT NO.:  [id number]   MEDICAL RECORD NO.:  [id number]          PATIENT TYPE:  INP   LOCATION:  6706                         FACILITY:  [facility]   PHYSICIAN:  Coletta Memos, M.D.     DATE OF BIRTH:  1940/11/17   DATE OF ADMISSION:  09/29/2008  DATE OF DISCHARGE:                              HISTORY & PHYSICAL   ADMISSION DIAGNOSES:  1. Right lower extremity pain, status post subdural hematoma      evacuation November 2009.  2. Urinary tract infection.   INDICATIONS:  [name] is a woman, who was initially admitted in  November, I believe, on August 22, 2008, secondary to lower extremity  weakness and dysesthesias.  MRI revealed a subdural hematoma in the  thoracolumbar spinal regions.  She was taken to the operating room by  Dr. Jeral Fruit, where she underwent a lumbar laminectomy and had the  subdural hematoma evacuated.  There is a mass noted at T11, which is on  the right side and causing some mild cord compression more of a  displacement as opposed to frank compression of the cord itself.  She  has undergone a repeat MRI.  She has also undergone 2 angiograms one  being selective.  The angiograms were both negative.  It was assumed  that she had some sort of dural AV fistula, some sort of venous or  vascular abnormality.  However, that has not yet been identified again  with two negative angiograms.  She has also had multiple MRIs and the  lesion at T11 today is smaller than it was on the last scan done in  November.  Nevertheless, [name] states that over the last 2 weeks so,  she has had increasing pain.  In her own words, she feels that as if she  has regressed.  Her husband was informed by one of my partners to double  the fentanyl patch dose, that was done and her husband states that she  was just getting worse and at this point in time, he felt that he needed  to bring her into the emergency room.  He did call our office today and  I  did call him back on two occasions, but we were never able to connect.  I was contacted after she was seen in the emergency room and then an MRI  was ordered.   Past medical history also includes gastroesophageal reflux,  hypertension, hypothyroidism, and osteoarthritis.  She does not smoke.  She does not use illicit drugs.  She does not use alcohol.   List of medications include Nexium, Levoxyl, Diovan, fentanyl, diazepam,  Lyrica, and Tylenol.   On examination, she is lying in the hallway in the [facility] Emergency  Room.  Her speech is slurred.  She is lethargic, is answering questions.  She is slightly confused at times.  On examination, she appears to have  full strength of the upper and lower extremities.  She has symmetric  facies.  Tongue and uvula are in the midline.  Pupils are equal, they  are round and they  are reactive to light bilaterally.   Vital Signs:  Temperature of 99, pulse 86, respiratory rate 19, and  oxygen saturation is 99%.   The nurse, who took her urine samples stated that the urine was  malodorous, discolored.  The urine sample did come back with positive  urine nitrite, large leukocyte esterase, and urine white blood cells  which were too numerous to count, and bacteria were many.   Lung fields are clear.  Heart, regular rhythm and rate.  No murmurs or  rubs are appreciated.  Abdomen is soft and nontender.  She does have  some mild edema in the lower extremities.  No clubbing, cyanosis, or  edema is present.  Light touch is intact.  I did not assess gait.  She  does have an allergy to AMOXICILLIN.   I will have [name] admitted.  I believe that the lethargy is caused  by the doubling in the fentanyl along with the information that her  husband provided that he continues to give her the Valium on a scheduled  basis and has been doing so for quite some time.  She also needs to be  treated for her urinary tract infection.  I will start her on Bactrim   DS.  I think this would account for the urinary urgency that she states  she has had only for the last 2-3 days.  Her strength has not changed,  but she says she is moving more slowly when she walks.  Although she is  moving quite easily in the bed and her husband admit that she is moving  better now.  She states that when she stands that she does not have the  same ability to move her legs.   No surgical intervention is anticipated at this point in time.  I would  like to see what happens once we do treat the urinary tract infection  and have [name] a little bit more alert, so we can get more  information from her, but certainly there is nothing on MRI which has  changed for the worse in terms of surgical problem.  She does have  significant signal present in the distal cord and conus.  However, since  she has had the severe dysesthesias since presentation, I would tend  more to believe that these are changes which have been set motion by the  previous unknown cause of the subdural hematoma.           ______________________________  Coletta Memos, M.D.     KC/MEDQ  D:  09/29/2008  T:  09/30/2008  Job:  [id number]

## 2011-02-15 NOTE — Discharge Summary (Signed)
[name], [name]                ACCOUNT NO.:  [id number]   MEDICAL RECORD NO.:  [id number]          PATIENT TYPE:  INP   LOCATION:  3014                         FACILITY:  [facility]   PHYSICIAN:  Hilda Lias, M.D.   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   FINAL DIAGNOSES:  1. Status post lumbar subdural hematoma.  2. 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   CLINICAL HISTORY:  [name] is a lady who was admitted in November 2009  with swelling associated with weakness of both lower extremities.  We  found that she had a subdural hematoma.  We did surgery and she did  well.  We did several angiograms to try to find the source and all the  angiograms were negative.  The patient was discharged home, and she was  readmitted on September 29, 2008, under Dr. Franky Macho.  The patient was  complaining of worsening pain.  Repeat MRI showed that there might be a  lesion at the level of thoracic 11 on the right side.  Because of that,  surgery was advised.   LABORATORY DATA:  Normal.   COURSE IN THE HOSPITAL:  The patient was taken to surgery and had C10-  C11 laminectomy with removal of dorsolateral arteriovenous malformation  was done.  The patient did well.  The pain improved considerably.  She  had been able to ambulate.  We were ready to discharge her late last  week, but she developed swelling of both lower extremities.  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  Because of that, she had a filter  introduced in the vena cava.  Today, she is ambulating.  She is feeling  much better, and physical therapy and occupational therapy feels that  she is not a candidate to rehab unit.  She is being discharged today to  followed by me.  In the meantime, because of her depression,  she was  seen by the psychiatrist.   CONDITION AT DISCHARGE:  Stable.   MEDICATIONS:  Percocet, Cymbalta, and Lyrica.   DIET:  Regular.   ACTIVITY:  Not to drive, not to bend, not to do any lifting.   FOLLOWUP:  I will be seeing her in 4-6 weeks and also she has an  appointment to be seen by Dr. Earl Gala, who is the medical doctor.  If  any questions, they are going to call me anytime.          ______________________________  Hilda Lias, M.D.    EB/MEDQ  D:  10/20/2008  T:  10/21/2008  Job:  [id number]

## 2011-02-15 NOTE — Op Note (Signed)
[name], [name]                ACCOUNT NO.:  [id number]   MEDICAL RECORD NO.:  [id number]          PATIENT TYPE:  INP   LOCATION:  2102                         FACILITY:  [facility]   PHYSICIAN:  Hilda Lias, M.D.   DATE OF BIRTH:  1941/08/04   DATE OF PROCEDURE:  08/14/2008  DATE OF DISCHARGE:                               OPERATIVE REPORT   PREOPERATIVE DIAGNOSES:  1. Sudden onset of paralysis of the right leg.  2. Thoracic and lumbar subdural hematoma.   POSTOPERATIVE DIAGNOSES:  1. Sudden onset of paralysis of the right leg.  2. Thoracic and lumbar subdural hematoma.   PROCEDURE:  Bilateral L2-L3 laminectomy, evacuation of intradural  subarachnoid and subdural hematoma, microscope.   SURGEON:  Hilda Lias, MD   CLINICAL HISTORY:  [name] is a lady who came this morning to the  hospital walking complaining of sudden onset of back pain with radiation  to the right leg.  She was complaining of burning sensation.  According  to her, yesterday morning, August 14, 2008, she woke up complaining of  burning pain.  She was to be seen by Dr. Earl Gala, but because of the  pain, they came straight to the emergency room.  It seemed that she was  seen in the emergency room several days prior to this.  There was no  chest pain.  According to the hospital and the patient herself in the  emergency room, she developed more weakness.  They did the MRI of the  lumbar spine and because of the finding, they did an MRI and thoracic  spine.  I was called to see this lady about 6 to 6:30 in the afternoon.  After that, I talked to the family at length.  I waited for them to do  the thoracic MRI and then came to talk today about the possibility that  the hematoma that she was having came through the thoracic area and now  was affecting the right leg.  She was paralyzed in the right side.  I  mentioned to them that ideally we will do a thoracic angiogram to find  out what is going on with the  spinal cord and the thoracic level, but  that is going to take at least 10 hours.  I am really concerned about  waiting too long she might not improve in the right leg and probably she  will get worse in the left leg.  Because of that, we decided to go ahead  with surgery immediately and do a further workup later on.  Both of them  knew the risk including the possibility of no improvement whatsoever or  worsening of the left leg.   PROCEDURE:  The patient was taken to the OR and she was positioned on  prone manner.  The back was cleaned with DuraPrep.  I encountered the  spinous process and I did incision from the L1 to the upper part of L4.  Incision was carried out through the subcutaneous space.  Muscle will be  separated laterally.  X-rays show that the clamp was at the  level L2 and  L3.  Then, with the Leksell, we removed the spinous process of L2-L3.  With the drill, we removed the lamina of L2 and L3 and the lower part of  L1.  The yellow ligament was also excised.  Immediately, I found that  the dura mater was more bluish and it was really tense.  Immediately, I  brought the microscope into the area.  A small incision was done in the  dura mater and soon after high-pressure liquid blood came.  I extended  incision all the way down to the lower part of the incision right at the  level of L2 and L3.  Indeed, the patient has a mix of subdural hematoma  as well as right femoral hematoma.  Evacuation using micro catheter was  done.  Then, we did our dissection to remove all the hematoma all the  way up at the level of L1 using the #18 catheter.  At the end, we had  good decompression of the cauda equina.  Then after having good  decompression, no specimen was  sent since all of this was liquid blood.  The dura mater was closed  using 6-0 Prolene.  The Valsalva maneuver was negative.  Nevertheless,  we used a Tisseel in the epidural space.  Then, the wound was closed  with Vicryl and  Steri-Strips.  The patient is going to go to the  intensive care unit.            ______________________________  Hilda Lias, M.D.     EB/MEDQ  D:  08/15/2008  T:  08/15/2008  Job:  [id number]

## 2011-02-15 NOTE — Consult Note (Signed)
[name], [name]                ACCOUNT NO.:  [id number]   MEDICAL RECORD NO.:  [id number]          PATIENT TYPE:  INP   LOCATION:  3014                         FACILITY:  [facility]   PHYSICIAN:  Antonietta Breach, M.D.  DATE OF BIRTH:  16-Jun-1941   DATE OF CONSULTATION:  DATE OF DISCHARGE:                                 CONSULTATION   REQUESTING PHYSICIAN:  Dr. Jeral Fruit of neurosurgery.   REASON FOR CONSULTATION:  Depression.   HISTORY OF PRESENT ILLNESS:  [name] is a 70 year old female admitted  to the [facility] on September 29, 2008 due to a subdural hematoma.   She has undergone a series of surgical procedures and chronic pain.  Please see the past medical history.   She has chronic frustration with so many people helping her and not  being able to help them back.  She has approximately 3 weeks of  progressive depressed mood, poor appetite for everything except sweets,  decreased energy, poor concentration, anhedonia as well as thoughts of  hopelessness and helplessness.  She also had insomnia yesterday.   Yesterday she made the statement that she is fed up and that she does  not know if she can go on.   She describes these as figures of speech and she denies any thoughts of  suicide.  Also, she has not had any hallucinations or delusions.  She  has no thoughts of harming others.   Her orientation and memory function are intact.  However, she has been  making statements that are mood congruent and unusual for her character.  When her primary care physician discussed the possibility of a skilled  nursing facility for rehabilitation after surgery, [name] accused  her husband of wanting to put her way in a skilled nursing facility.  She did not persist with this belief.  She has been uncharacteristically  irritable with her husband.   She has been having adverse effects with some of her opioid pain  medication.   PAST PSYCHIATRIC HISTORY:  No prior history of  depression or other  psychiatric disorders.   FAMILY PSYCHIATRIC HISTORY:  [name]'s sister has suffered from  depression and has been treated.   SOCIAL HISTORY:  [name]'s mother did require staying in a skilled  nursing facility prior to passing away.   [name] grew up [place].  She states that she was  in a poor family.  She went on to be educated through high school.  She  has been a supportive homemaker with enjoying gardening in the past.  She has never used alcohol or illegal drugs.  She has 1 son and 2  sisters.  She has a very mutually supportive marriage.  She has many  supportive friends.   PAST MEDICAL HISTORY:  Recent worsening of right lower extremity pain.  She already has a history of subdural hematoma evacuation in November of  2009 and she required a T10-T11 laminectomy, lysis of adhesions, and  removal of dorsolateral arteriovenous malformation on October 10, 2008  this past week.  The AVM was compressing  the spinal cord and the  thoracic 11 nerve root.   Right thyroid lobectomy with Synthroid supplementation 100 mcg daily at  night.  A history of right endoscopic sphenoidotomy nasal septoplasty.   MEDICATIONS:  The MAR is reviewed.  Psychotropics include Synthroid 100  mcg daily.  She is on oxycodone 10 mg b.i.d., Lyrica 50 mg b.i.d.,  Valium 5 mg q.6 h. p.r.n. and has received 10 mg over the past day.  Ambien 10 mg q.h.s. p.r.n.   ALLERGIES:  AMOXICILLIN.   LABORATORY DATA:  Sodium 134, BUN 18, creatinine 0.84, glucose 208, WBC  14.5, hemoglobin 12.9, platelet count 169.   SGOT 19, SGPT 28.   REVIEW OF SYSTEMS:  Constitutional, head, eyes, ears, nose, throat,  mouth, neurologic, psychiatric, cardiovascular, respiratory,  gastrointestinal, genitourinary, skin, musculoskeletal, hematologic,  lymphatic, endocrine, metabolic all unremarkable except it is noted that  she has had intact memory and orientation.   She has had a  decreased capacity for deferred gratification and has been  impulsive regarding activities such as getting a shower before her IV  and other hospital room support is situated appropriately.   No agitation or combativeness.   EXAMINATION:  VITAL SIGNS:  Temperature 98, pulse 73, respiratory rate  18, blood pressure 115/62, O2 saturation on room air 96%.  GENERAL APPEARANCE:  [name] is an elderly female lying in a supine  position in her hospital bed with no abnormal involuntary movements.   MENTAL STATUS EXAM:  [name] has intact eye contact.  Her attention  span is slightly decreased.  Concentration is mildly decreased.  Her  affect is constricted.  Mood is depressed.  She is oriented to all  spheres.  Memory is intact to immediate recent and remote.  Fund of  knowledge and intelligence within normal limits.  Speech involves normal  rate and prosody without dysarthria.  Her speech is soft, however.  Thought process is logical, coherent, goal-directed.  No looseness of  associations.  Thought content:  No thoughts of harming herself or  others.  No delusions or hallucinations.  Insight is partial.  Judgment  is intact.   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  Major depressive disorder, single episode, severe.  AXIS II:  None.  AXIS III:  See past medical history.  AXIS IV:  General medical.  AXIS V:  55.   [name] is not at risk to harm herself or others.  She does agree to  call emergency services immediately for any psychiatric emergency  symptoms.   The undersigned provided ego supportive psychotherapy and education.   The indications, alternatives and adverse effects of the following were  discussed with the patient:  Cymbalta for anti depression as well as  helping to treat pain potentially in the context of major depression;  trazodone for anti insomnia including potential  augmentation of  Cymbalta.   [name] understands the above information and she wants to think more  about starting the medication before she proceeds with the medication.   With [name]'s approval, the undersigned did discuss depression and  treatment with her husband in order to facilitate support and education.   DISCUSSION:  As with the classic tricyclic agents that have been used  for chronic pain involving serotonin and norepinephrine reuptake  inhibition increasing pain gating, Cymbalta can provide this without the  anticholinergic adverse effects of the older agents.   RECOMMENDATIONS:  If Mrs. Traynham agrees, would then start Cymbalta 20 mg  p.o. q.a.m. with caution about nausea or loose stools as serotonin side  effects.   Would utilize trazodone 25 mg q.h.s. p.r.n. insomnia, discontinuing the  Ambien, if Mrs. Calvert eventually agrees.   The trazodone can be adjusted by 25 mg per evening based upon the  previous evening's sleep pattern, not to exceed 100 mg q.h.s.   PRELIMINARY DISCHARGE PLANNING:  Psychiatric followup is available at  one of the clinics attached to [facility], [facility] or  [facility].  There are also private psychiatrists in the  community.   The social worker can help arrange followup.  Psychotherapy can be  beneficial in synergizing with psychotropic medication.      Antonietta Breach, M.D.  Electronically Signed     JW/MEDQ  D:  10/17/2008  T:  10/17/2008  Job:  [id number]

## 2011-02-15 NOTE — Consult Note (Signed)
NAMELETIA, GUIDRY                ACCOUNT NO.:  [id number]   MEDICAL RECORD NO.:  [id number]          PATIENT TYPE:  OUT   LOCATION:  NUC                          FACILITY:  [facility]   PHYSICIAN:  Hilda Lias, M.D.   DATE OF BIRTH:  1940/12/09   DATE OF CONSULTATION:  DATE OF DISCHARGE:                                 CONSULTATION   Mrs. Akhter is a lady who was admitted 3-1/2 weeks ago through the  emergency room because of a dense weakness in both legs, and she ended  up having surgery where I removed a hematoma in the spinal cord.  The  patient was kept on medications.  She was discharged and went out of  town for a meeting.  She was readmitted by Dr. Pershing Proud because of  continuation of burning pain in the right leg.  During the admission,  she had a repeat MRI and a second angiogram.  The previous angiogram was  negative.  The second angiogram done by Dr. Corliss Skains was also negative.  The MRI showed that there was some area around thoracic 11, which might  be abnormal but with a negative angiogram.  Nevertheless, the MRI was  done without contrast.  Today after I came to town, I sat with Mr. and  Mrs. Pasqua for at least 45 minutes, and I talked to them about what is  going on.  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  The  angiogram twice is negative, and the MRI done in the second admission  showed some question around the thoracic 11.  Because of that, they were  told that she was going to have surgery.  I told them I would prefer not  to explore the area.  One other reason is because she is not getting  worse clinically from the point of view of strength with a normal  bladder and bowel.  Prior to me talking to both of them, I talked to Dr.  Corliss Skains.  I reviewed all the studies done in the first and the second  admission.  I fully agree with Dr. Corliss Skains that we do not know what  type of  lesion is right there in that area of thoracic 11.  Most of [place]  agree that we should wait for the swelling to go away, and then, we can  repeat the MRI of the thoracolumbar area with and without contrast just  to see if there is something else going on.  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  I mentioned to them that in this case  the best treatment is to wait and not to rush to surgery because not  knowing what we are likely looking for we can produce more damage than  she is having at the present time.  Both of them fully agreed, and  today, I lowered the morphine from high doses to lower doses and  using a  fentanyl patch to see how well she does and whether she can go home with  less pain and then proceed with the MRI in 3-4 weeks once the swelling  is down.  I also promised to them that if there is something, which is  highly unusual, I would be more than happy to make a referral to one  other medical center.  Both of them fully agreed with the situation; and  after that, I went back to Dr. Corliss Skains, and he kindly came back and  talked to them about all the findings, which essentially are negative in  the studies.           ______________________________  Hilda Lias, M.D.     EB/MEDQ  D:  09/08/2008  T:  09/09/2008  Job:  [id number]

## 2011-02-15 NOTE — Consult Note (Signed)
NAMEARICELA, BERTAGNOLLI                ACCOUNT NO.:  [id number]   MEDICAL RECORD NO.:  [id number]          PATIENT TYPE:  INP   LOCATION:  3014                         FACILITY:  [facility]   PHYSICIAN:  Antonietta Breach, M.D.  DATE OF BIRTH:  1941/01/07   DATE OF CONSULTATION:  10/17/2008  DATE OF DISCHARGE:                                 CONSULTATION   ADDENDUM:  The undersigned will check her TSH to rule out a return of a  hypothyroid level as a factor in depression.  Antonietta Breach, M.D.  Electronically Signed     JW/MEDQ  D:  10/17/2008  T:  10/17/2008  Job:  [id number]

## 2011-02-18 NOTE — Op Note (Signed)
NAMEPERL, FOLMAR                ACCOUNT NO.:  [id number]   MEDICAL RECORD NO.:  [id number]          PATIENT TYPE:  AMB   LOCATION:  DSC                          FACILITY:  [facility]   PHYSICIAN:  Jefry H. Pollyann Kennedy, MD     DATE OF BIRTH:  January 28, 1941   DATE OF PROCEDURE:  09/06/2004  DATE OF DISCHARGE:                                 OPERATIVE REPORT   PREOPERATIVE DIAGNOSES:  1.  Right chronic sphenoid sinusitis.  2.  Nasal septal deviation.  3.  Inferior turbinate hypertrophy.   POSTOPERATIVE DIAGNOSES:  1.  Right chronic sphenoid sinusitis.  2.  Nasal septal deviation.  3.  Inferior turbinate hypertrophy.   OPERATION PERFORMED:  1.  Right endoscopic sphenoidotomy.  2.  Nasal septoplasty.  3.  Outfracture, right inferior turbinate.   SURGEON:  Jefry H. Pollyann Kennedy, MD   ANESTHESIA:  General endotracheal.   COMPLICATIONS:  None.   ESTIMATED BLOOD LOSS:  Minimal.   FINDINGS:  Large septal spur posteriorly obscuring the sphenoethmoidal  recess area.  Medially displaced inferior turbinate on the right causing  obstruction of the nasal airway on the right.  Thickened bone of the face of  the sphenoid on the right with dense polypoid tissue within the sphenoid and  a fragment of the fundal debris.   REFERRING PHYSICIAN:  Theressa Millard, M.D.   The patient tolerated the procedure well, was awakened, extubated and  transferred to recovery in stable condition.   INDICATIONS FOR PROCEDURE:  The patient is a 70 year old lady with a history  of chronic sphenoid sinusitis as evidenced on repeated CT scans not  responsive to prolonged antibiotic therapy felt to be due to fungal disease.  Risks, benefits, alternatives and complications of the procedure were  explained to the patient and her husband who seemed to understand and agreed  to surgery.   DESCRIPTION OF PROCEDURE:  The patient was taken to the operating room and  placed on the operating table in supine position.  Oxymetazoline  spray was  used preoperatively in the nasal cavities.  The face was prepped and draped  in the standard fashion.  1% Xylocaine with epinephrine was infiltrated into  the superior and posterior attachments of the middle turbinate, the superior  turbinate, the inferior turbinate, the posterior septum and the face of the  sphenoid.   (1)  Outfracture of the right inferior turbinate.  The inferior turbinate  prevented passage of the nasal endoscopes and was outfractured using a Market researcher.  This provided much better access to the middle and posterior  nasal passageway.  (2)  Nasal septoplasty.  There was a large bony spur posteriorly just  anterior to the face of the sphenoid.  I was not able to gain access at all  to the face of the sphenoid and decision was made to perform a  limited  septoplasty.  A Freer elevator was used to incise the mucosa to elevate the  mucosa off of this bony spur. The bone was punctured with the [facility] and then  the contralateral mucosa was elevated as well.  Large fragments of ethmoid  plate bone were resected relieving this obstruction.  (3)  Right endoscopic sphenoidotomy.  The face of the sphenoid was uncovered  using a Therapist, nutritional.  I was not able to pass a straight suction through  the face of the face of the sphenoid bone.  A 2 mm osteotome was then used  to cut out a small square of the medial aspect of the face of the sphenoid  right up against the posterior septum.  Following this maneuver, I was able  to gain better access into the sinus and inspect it.  Measurements were  made.  The face of the sphenoid was at 7 cm from the nasal piriform aperture  and the posterior wall was just short of 9 cm.  Once the sinus was opened, I  was able to use a Kerrison to enlarge the opening medially and inferiorly  and additional fragments were removed using a 2 mm osteotome.  I was able to  inspect within the sinus.  There was a fleshy polypoid mass medially and  up  high.  Fragments of this were taken and sent for pathologic evaluation.  In  addition, there was a 2 to 3 mm chunk of what appeared to be fungal debris  that was suctioned out.  I was not able to save this.  It passed through the  suction.  There was no other findings noted.  I decided not to try to remove  the complete mass of tissue because of not being sure of its attachment to  any other underlying structures such as the carotid.  Following this, the  sphenoid was irrigated with saline as was the nasal cavity.  The right nasal  cavity was packed with rolled up Telfa coated with bacitracin.  Pharynx was  suctioned of blood and secretions.  The patient was then awakened, extubated  and transferred to recovery in stable condition.      Jefr   JHR/MEDQ  D:  09/06/2004  T:  09/06/2004  Job:  [id number]   cc:   Theressa Millard, M.D.  [address] [place]  [place] [address]  Fax: [phone]

## 2011-02-18 NOTE — Op Note (Signed)
[name], Gross                            ACCOUNT NO.:  [id number]   MEDICAL RECORD NO.:  [id number]                   PATIENT TYPE:  OIB   LOCATION:  2550                                 FACILITY:  [facility]   PHYSICIAN:  Angelia Mould. Derrell Lolling, M.D.             DATE OF BIRTH:  November 17, 1940   DATE OF PROCEDURE:  07/24/2002  DATE OF DISCHARGE:                                 OPERATIVE REPORT   PREOPERATIVE DIAGNOSIS:  Right thyroid nodule.   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   OPERATION PERFORMED:  Right thyroid lobectomy.   SURGEON:  Angelia Mould. Derrell Lolling, M.D.   FIRST ASSISTANT:  Sandria Bales. Ezzard Standing, M.D.   OPERATIVE INDICATIONS:  This is a 70 year old white female who has a history  of multinodular goiter in the past.  She had an ultrasound recently which  showed a prominent 2-cm degenerated nodule in the right thyroid lobe  partially cystic and partially solid with some thickening of the wall.  Fine  needle aspiration cytology revealed follicular cells some of which were  atypical, and old hemorrhage.  She was sent for an evaluation.  On exam, she  had a slight fullness in the right thyroid lobe although not a discrete  nodule.  She was given the option of undergoing right thyroid lobectomy to  rule out cancer, and she had decided that she wanted to do that.  She was  brought to the operating room electively.   OPERATIVE TECHNIQUE:  Following the induction of general endotracheal  anesthesia, the patient's neck was extended, and the neck was prepped and  draped in a sterile fashion.  A transverse incision was made in the neck  about 2 cm above the clavicle exactly in one of her skin lines.  This  incision was about 6-7 cm long.  Dissection was carried down through the  subcutaneous tissue and through the platysma muscle.  Skin flaps were raised  superiorly and inferiorly, and a self-retaining retractor was placed.  The  strap muscles were divided in the midline, and mobilized off the thyroid  lobes bilaterally.  The left thyroid lobe was palpated, and it felt slightly  lumpy, but there was no dominant mass.  The right thyroid lobe contained a  firm nodule in the upper pole and a firm nodule in the lower pole, both less  than 1.5 cm in size.  There was no adenopathy.   We mobilized the upper pole of the thyroid gland.  It was controlled with 2-  0 silk ties and metal clips and divided.  Inferior pole was mobilized.  Middle thyroid vein was isolated, secured with metal clips, and divided.  The inferior thyroid artery was isolated, secured with metals clips, and  divided.  We identified the recurrent laryngeal nerve, and it was quite easy  to visualize and easy to preserve.  We mobilized  the thyroid gland up off  its bed and off the trachea and divided small vessels between metal clips.  Ultimately, we divided the ligament of Berry.  We examined the recurrent  laryngeal nerve, and it looked fine.  We mobilized the thyroid isthmus off  the trachea.  We clamped the thyroid isthmus to the left of the trachea with  hemostats and divided the thyroid gland and sent it to pathology.  Frozen  section diagnosis revealed a benign follicular process, read out by Dr.  Almyra Free.  We did not feel that left thyroid lobectomy was indicated.  We tied  off the thyroid isthmus with suture ligatures of 3-0 Vicryl.  We had good  hemostasis there.  We irrigated the wound.  We had excellent hemostasis.  We  identified the superior and inferior parathyroid glands on the right side as  we went along, and they were still intact.  Small piece of Surgical was  placed in the thyroid bed and observed for five minutes, and there was no  bleeding.  We then closed the strap muscles with interrupted sutures of 3-0  Vicryl.  Platysma muscle was closed with interrupted sutures of 3-0 Vicryl,  and the skin was closed with a few skin staples and  some Steri-Strips.  Clean bandages were placed, and the patient taken to the recovery room in  stable condition.  Estimated blood loss was about 25 cc.   COMPLICATIONS:  None.   SPONGE AND INSTRUMENT COUNTS:  Correct.                                               Angelia Mould. Derrell Lolling, M.D.    HMI/MEDQ  D:  07/24/2002  T:  07/24/2002  Job:  [id number]   cc:   Theressa Millard, M.D.  [address] [place]  [place] [address]  Fax: [phone]

## 2011-03-10 ENCOUNTER — Other Ambulatory Visit: Payer: Self-pay | Admitting: Obstetrics and Gynecology

## 2011-03-10 ENCOUNTER — Other Ambulatory Visit (HOSPITAL_COMMUNITY)
Admission: RE | Admit: 2011-03-10 | Discharge: 2011-03-10 | Disposition: A | Discharge status: Home / Self Care | Payer: Medicare Other | Source: Ambulatory Visit | Attending: Obstetrics and Gynecology | Admitting: Obstetrics and Gynecology

## 2011-03-10 DIAGNOSIS — Z124 Encounter for screening for malignant neoplasm of cervix: Secondary | ICD-10-CM | POA: Insufficient documentation

## 2011-03-24 ENCOUNTER — Other Ambulatory Visit (HOSPITAL_COMMUNITY): Payer: Self-pay | Admitting: Anesthesiology

## 2011-03-24 ENCOUNTER — Ambulatory Visit (HOSPITAL_COMMUNITY)
Admission: RE | Admit: 2011-03-24 | Discharge: 2011-03-24 | Disposition: A | Discharge status: Home / Self Care | Payer: Medicare Other | Source: Ambulatory Visit | Attending: Anesthesiology | Admitting: Anesthesiology

## 2011-03-24 DIAGNOSIS — R209 Unspecified disturbances of skin sensation: Secondary | ICD-10-CM | POA: Insufficient documentation

## 2011-03-24 DIAGNOSIS — R52 Pain, unspecified: Secondary | ICD-10-CM

## 2011-03-24 DIAGNOSIS — M533 Sacrococcygeal disorders, not elsewhere classified: Secondary | ICD-10-CM | POA: Insufficient documentation

## 2011-07-05 LAB — APTT
aPTT: 27
aPTT: 30

## 2011-07-05 LAB — PROTIME-INR
INR: 1.2 (ref 0.00–1.49)
INR: 1.1
INR: 1.1
Prothrombin Time: 15.4 seconds — ABNORMAL HIGH (ref 11.6–15.2)
Prothrombin Time: 14
Prothrombin Time: 15

## 2011-07-05 LAB — URINALYSIS, ROUTINE W REFLEX MICROSCOPIC
Bilirubin Urine: NEGATIVE
Ketones, ur: NEGATIVE
Ketones, ur: NEGATIVE
Nitrite: NEGATIVE
Nitrite: NEGATIVE
Protein, ur: NEGATIVE
Protein, ur: NEGATIVE
Urobilinogen, UA: 0.2
Urobilinogen, UA: 0.2
pH: 7

## 2011-07-05 LAB — DIFFERENTIAL
Basophils Absolute: 0.1
Eosinophils Relative: 1
Lymphocytes Relative: 20
Lymphocytes Relative: 38
Lymphs Abs: 1.4
Lymphs Abs: 3.3
Monocytes Absolute: 0.9
Monocytes Relative: 7
Neutrophils Relative %: 72

## 2011-07-05 LAB — COMPREHENSIVE METABOLIC PANEL
ALT: 36 U/L — ABNORMAL HIGH (ref 0–35)
Alkaline Phosphatase: 85 U/L (ref 39–117)
BUN: 16 mg/dL (ref 6–23)
CO2: 24 mEq/L (ref 19–32)
CO2: 20
Calcium: 10.3
Chloride: 105 mEq/L (ref 96–112)
Creatinine, Ser: 0.9
GFR calc Af Amer: >60
GFR calc non Af Amer: >60
Glucose, Bld: 110 mg/dL — ABNORMAL HIGH (ref 70–99)
Glucose, Bld: 138 — ABNORMAL HIGH
Potassium: 3.5 mEq/L (ref 3.5–5.1)
Sodium: 135 mEq/L (ref 135–145)
Total Bilirubin: 0.8 mg/dL (ref 0.3–1.2)
Total Protein: 5.8 g/dL — ABNORMAL LOW (ref 6.0–8.3)
Total Protein: 7

## 2011-07-05 LAB — CBC
HCT: 35.4 % — ABNORMAL LOW (ref 36.0–46.0)
HCT: 40.8
Hemoglobin: 12.1 g/dL (ref 12.0–15.0)
Hemoglobin: 13.7
Hemoglobin: 14.1
MCHC: 34
MCV: 88.4
RBC: 3.98 MIL/uL (ref 3.87–5.11)
RBC: 4.55
RDW: 13.5 % (ref 11.5–15.5)
RDW: 13.6
RDW: 13.7
WBC: 8.4 10*3/uL (ref 4.0–10.5)

## 2011-07-05 LAB — BASIC METABOLIC PANEL
BUN: 17
CO2: 24
Calcium: 8.9
Chloride: 108
Creatinine, Ser: 0.62
Glucose, Bld: 150 — ABNORMAL HIGH

## 2011-07-05 LAB — POCT I-STAT, CHEM 8
BUN: 15
Creatinine, Ser: 0.9
Glucose, Bld: 96
HCT: 42
Hemoglobin: 14.3

## 2011-07-05 LAB — URINE MICROSCOPIC-ADD ON

## 2011-07-05 LAB — GLUCOSE, CAPILLARY: Glucose-Capillary: 137 — ABNORMAL HIGH

## 2011-07-05 LAB — LIPASE, BLOOD: Lipase: 31

## 2011-07-07 LAB — BASIC METABOLIC PANEL
BUN: 26 mg/dL — ABNORMAL HIGH (ref 6–23)
CO2: 25 mEq/L (ref 19–32)
Calcium: 9 mg/dL (ref 8.4–10.5)
Creatinine, Ser: 0.73 mg/dL (ref 0.4–1.2)
GFR calc non Af Amer: >60 mL/min (ref >60)
Glucose, Bld: 161 mg/dL — ABNORMAL HIGH (ref 70–99)
Sodium: 127 mEq/L — ABNORMAL LOW (ref 135–145)

## 2011-07-07 LAB — GRAM STAIN

## 2011-07-07 LAB — URINE CULTURE

## 2011-07-07 LAB — URINE MICROSCOPIC-ADD ON

## 2011-07-07 LAB — CBC
Hemoglobin: 12.5 g/dL (ref 12.0–15.0)
MCHC: 33 g/dL (ref 30.0–36.0)
Platelets: 205 10*3/uL (ref 150–400)
RDW: 13.6 % (ref 11.5–15.5)

## 2011-07-07 LAB — URINALYSIS, ROUTINE W REFLEX MICROSCOPIC
Nitrite: POSITIVE — AB
Protein, ur: NEGATIVE mg/dL
Specific Gravity, Urine: 1.017 (ref 1.005–1.030)
Urobilinogen, UA: 1 mg/dL (ref 0.0–1.0)

## 2011-12-20 ENCOUNTER — Ambulatory Visit (HOSPITAL_COMMUNITY)
Admission: RE | Admit: 2011-12-20 | Discharge: 2011-12-20 | Disposition: A | Discharge status: Home / Self Care | Payer: Medicare Other | Source: Ambulatory Visit | Attending: Anesthesiology | Admitting: Anesthesiology

## 2011-12-20 ENCOUNTER — Other Ambulatory Visit (HOSPITAL_COMMUNITY): Payer: Self-pay | Admitting: Anesthesiology

## 2011-12-20 DIAGNOSIS — M25559 Pain in unspecified hip: Secondary | ICD-10-CM

## 2011-12-20 DIAGNOSIS — M899 Disorder of bone, unspecified: Secondary | ICD-10-CM | POA: Insufficient documentation

## 2012-06-07 ENCOUNTER — Other Ambulatory Visit: Payer: Self-pay | Admitting: Internal Medicine

## 2012-06-07 DIAGNOSIS — Z1231 Encounter for screening mammogram for malignant neoplasm of breast: Secondary | ICD-10-CM

## 2012-06-13 ENCOUNTER — Ambulatory Visit
Admission: RE | Admit: 2012-06-13 | Discharge: 2012-06-13 | Disposition: A | Discharge status: Home / Self Care | Payer: Medicare Other | Source: Ambulatory Visit | Attending: Internal Medicine | Admitting: Internal Medicine

## 2012-06-13 DIAGNOSIS — Z1231 Encounter for screening mammogram for malignant neoplasm of breast: Secondary | ICD-10-CM

## 2012-09-29 ENCOUNTER — Emergency Department (HOSPITAL_COMMUNITY): Payer: Medicare Other

## 2012-09-29 ENCOUNTER — Encounter (HOSPITAL_COMMUNITY): Payer: Self-pay | Admitting: Emergency Medicine

## 2012-09-29 ENCOUNTER — Inpatient Hospital Stay (HOSPITAL_COMMUNITY)
Admission: EM | Admit: 2012-09-29 | Discharge: 2012-10-02 | DRG: 536 | Disposition: A | Discharge status: Skilled Nursing Facility | Payer: Medicare Other | Attending: Internal Medicine | Admitting: Internal Medicine

## 2012-09-29 DIAGNOSIS — M810 Age-related osteoporosis without current pathological fracture: Secondary | ICD-10-CM | POA: Diagnosis present

## 2012-09-29 DIAGNOSIS — S32599A Other specified fracture of unspecified pubis, initial encounter for closed fracture: Secondary | ICD-10-CM

## 2012-09-29 DIAGNOSIS — G8929 Other chronic pain: Secondary | ICD-10-CM | POA: Diagnosis present

## 2012-09-29 DIAGNOSIS — Z86718 Personal history of other venous thrombosis and embolism: Secondary | ICD-10-CM

## 2012-09-29 DIAGNOSIS — I10 Essential (primary) hypertension: Secondary | ICD-10-CM | POA: Diagnosis present

## 2012-09-29 DIAGNOSIS — Y92009 Unspecified place in unspecified non-institutional (private) residence as the place of occurrence of the external cause: Secondary | ICD-10-CM

## 2012-09-29 DIAGNOSIS — Z79899 Other long term (current) drug therapy: Secondary | ICD-10-CM

## 2012-09-29 DIAGNOSIS — M5137 Other intervertebral disc degeneration, lumbosacral region: Secondary | ICD-10-CM | POA: Diagnosis present

## 2012-09-29 DIAGNOSIS — S32509A Unspecified fracture of unspecified pubis, initial encounter for closed fracture: Principal | ICD-10-CM | POA: Diagnosis present

## 2012-09-29 DIAGNOSIS — M51379 Other intervertebral disc degeneration, lumbosacral region without mention of lumbar back pain or lower extremity pain: Secondary | ICD-10-CM | POA: Diagnosis present

## 2012-09-29 DIAGNOSIS — E039 Hypothyroidism, unspecified: Secondary | ICD-10-CM | POA: Diagnosis present

## 2012-09-29 DIAGNOSIS — M5136 Other intervertebral disc degeneration, lumbar region: Secondary | ICD-10-CM

## 2012-09-29 DIAGNOSIS — W010XXA Fall on same level from slipping, tripping and stumbling without subsequent striking against object, initial encounter: Secondary | ICD-10-CM | POA: Diagnosis present

## 2012-09-29 DIAGNOSIS — S329XXA Fracture of unspecified parts of lumbosacral spine and pelvis, initial encounter for closed fracture: Secondary | ICD-10-CM

## 2012-09-29 HISTORY — DX: Personal history of other venous thrombosis and embolism: Z86.718

## 2012-09-29 HISTORY — DX: Essential (primary) hypertension: I10

## 2012-09-29 HISTORY — DX: Other chronic pain: G89.29

## 2012-09-29 HISTORY — DX: Other intervertebral disc degeneration, lumbar region: M51.36

## 2012-09-29 HISTORY — DX: Other intervertebral disc degeneration, lumbar region without mention of lumbar back pain or lower extremity pain: M51.369

## 2012-09-29 LAB — BASIC METABOLIC PANEL
BUN: 15 mg/dL (ref 6–23)
Calcium: 9.6 mg/dL (ref 8.4–10.5)
Chloride: 102 mEq/L (ref 96–112)
Creatinine, Ser: 0.86 mg/dL (ref 0.50–1.10)
GFR calc Af Amer: 76 mL/min — ABNORMAL LOW (ref >90)
GFR calc non Af Amer: 66 mL/min — ABNORMAL LOW (ref >90)

## 2012-09-29 LAB — CBC WITH DIFFERENTIAL/PLATELET
Basophils Absolute: 0.1 10*3/uL (ref 0.0–0.1)
Basophils Relative: 1 % (ref 0–1)
Eosinophils Absolute: 0.1 10*3/uL (ref 0.0–0.7)
HCT: 36.1 % (ref 36.0–46.0)
Hemoglobin: 11.4 g/dL — ABNORMAL LOW (ref 12.0–15.0)
MCH: 27.8 pg (ref 26.0–34.0)
MCHC: 31.6 g/dL (ref 30.0–36.0)
Monocytes Absolute: 0.4 10*3/uL (ref 0.1–1.0)
Monocytes Relative: 6 % (ref 3–12)
Neutro Abs: 5.6 10*3/uL (ref 1.7–7.7)
Neutrophils Relative %: 73 % (ref 43–77)
RDW: 13.6 % (ref 11.5–15.5)

## 2012-09-29 LAB — PROTIME-INR: Prothrombin Time: 14.3 seconds (ref 11.6–15.2)

## 2012-09-29 MED ORDER — HYDROMORPHONE HCL PF 1 MG/ML IJ SOLN
1.0000 mg | Freq: Once | INTRAMUSCULAR | Status: AC
  Administered 2012-09-30: 1 mg via INTRAVENOUS
  Filled 2012-09-29: qty 1

## 2012-09-29 MED ORDER — HYDROMORPHONE HCL PF 1 MG/ML IJ SOLN
1.0000 mg | Freq: Once | INTRAMUSCULAR | Status: AC
  Administered 2012-09-29: 1 mg via INTRAVENOUS
  Filled 2012-09-29: qty 1

## 2012-09-29 MED ORDER — MORPHINE SULFATE 4 MG/ML IJ SOLN
2.0000 mg | Freq: Once | INTRAMUSCULAR | Status: AC
  Administered 2012-09-29: 2 mg via INTRAVENOUS
  Filled 2012-09-29: qty 1

## 2012-09-29 MED ORDER — PREGABALIN 75 MG PO CAPS
75.0000 mg | ORAL_CAPSULE | Freq: Once | ORAL | Status: AC
  Administered 2012-09-30: 75 mg via ORAL

## 2012-09-29 NOTE — ED Notes (Signed)
Dr Yelverton at bedside.  

## 2012-09-29 NOTE — ED Notes (Signed)
Kaitlyn Szekalski, PA at bedside  

## 2012-09-29 NOTE — ED Notes (Signed)
Pt has chronic back pain and has had multiple surgeries in the past; pt denies any past medical history besides surgeries and denies and preexisting conditions. Pt is mentating appropriately with husband at the bedside.

## 2012-09-29 NOTE — ED Provider Notes (Signed)
History     CSN: [id number]  Arrival date & time 09/29/12  2018   First MD Initiated Contact with Patient 09/29/12 2032      Chief Complaint  Patient presents with  . Fall  . Hip Pain    (Consider location/radiation/quality/duration/timing/severity/associated sxs/prior treatment) HPI Comments: Patient is a 71 year old female who presents with left hip pain that started suddenly today when she fell backwards and landed on her left hip. Patient reports losing her balance prior to falling. She denies head trauma and LOC. Hip pain is aching and severe and radiates down her left leg. The pain is constant and made worse by any movement. No alleviating factors. No associated symptoms.    History reviewed. No pertinent past medical history.  Past Surgical History  Procedure Date  . Back surgery     History reviewed. No pertinent family history.  History  Substance Use Topics  . Smoking status: Never Smoker   . Smokeless tobacco: Not on file  . Alcohol Use: No    OB History    Grav Para Term Preterm Abortions TAB SAB Ect Mult Living                  Review of Systems  Musculoskeletal: Positive for arthralgias.  All other systems reviewed and are negative.    Allergies  Amoxicillin  Home Medications   Current Outpatient Rx  Name  Route  Sig  Dispense  Refill  . ALENDRONATE SODIUM 70 MG PO TABS   Oral   Take 70 mg by mouth every 7 (seven) days. Takes on Thursday Take with a full glass of water on an empty stomach.         Marland Kitchen CALCIUM + D PO   Oral   Take 1 tablet by mouth daily.         Marland Kitchen DOCUSATE SODIUM 100 MG PO CAPS   Oral   Take 100 mg by mouth daily.         Marland Kitchen LEVOTHYROXINE SODIUM 88 MCG PO TABS   Oral   Take 88 mcg by mouth daily.         Marland Kitchen MAGNESIUM PO   Oral   Take 1 tablet by mouth daily.         Marland Kitchen METHADONE HCL 5 MG PO TABS   Oral   Take 5 mg by mouth 4 (four) times daily.         Marland Kitchen OMEPRAZOLE 20 MG PO CPDR   Oral   Take 20 mg  by mouth daily.         Marland Kitchen POLYETHYLENE GLYCOL 3350 PO PACK   Oral   Take 17 g by mouth daily.         Marland Kitchen PREGABALIN 75 MG PO CAPS   Oral   Take 75 mg by mouth 4 (four) times daily.         Marland Kitchen VALSARTAN 80 MG PO TABS   Oral   Take 80 mg by mouth daily.         Marland Kitchen VITAMIN D (ERGOCALCIFEROL) PO   Oral   Take 1 tablet by mouth daily.           BP 154/63  Pulse 73  Temp 98.6 F (37 C) (Oral)  SpO2 96%  Physical Exam  Nursing note and vitals reviewed. Constitutional: She is oriented to person, place, and time. She appears well-developed and well-nourished. No distress.  HENT:  Head: Normocephalic and atraumatic.  Eyes: Conjunctivae normal are normal. No scleral icterus.  Neck: Normal range of motion. Neck supple.  Cardiovascular: Normal rate and regular rhythm.  Exam reveals no gallop and no friction rub.   No murmur heard. Pulmonary/Chest: Effort normal and breath sounds normal. She has no wheezes. She has no rales. She exhibits no tenderness.  Abdominal: Soft. She exhibits no distension. There is no tenderness. There is no rebound and no guarding.  Musculoskeletal:       Left hip and left knee tenderness to palpation. ROM limited due to pain. No obvious deformity.   Neurological: She is alert and oriented to person, place, and time.       Strength limited due pain. Sensation equal and intact bilaterally. Speech is goal-oriented. Moves limbs without ataxia.   Skin: Skin is warm and dry. She is not diaphoretic.  Psychiatric: She has a normal mood and affect. Her behavior is normal.    ED Course  Procedures (including critical care time)  Labs Reviewed  CBC WITH DIFFERENTIAL - Abnormal; Notable for the following:    Hemoglobin 11.4 (*)     Platelets 143 (*)     All other components within normal limits  BASIC METABOLIC PANEL - Abnormal; Notable for the following:    Glucose, Bld 146 (*)     GFR calc non Af Amer 66 (*)     GFR calc Af Amer 76 (*)     All other  components within normal limits  PROTIME-INR  APTT   Dg Hip Complete Left  09/29/2012  *RADIOLOGY REPORT*  Clinical Data: Fall with left hip pain.  LEFT HIP - COMPLETE 2+ VIEW  Comparison: None.  Findings: No acute fracture or dislocation identified of the left hip. The pelvis is rotated and it is very difficult to evaluate the left hemipelvis for possible fracture.  Left sided iliac venous stents are identified.  IMPRESSION: No evidence of acute hip fracture on the left.  Due to rotation of the pelvis, it is difficult to evaluate the left pubic bones for fracture.   Original Report Authenticated By: Irish Lack, M.D.    Dg Knee 1-2 Views Left  09/29/2012  *RADIOLOGY REPORT*  Clinical Data: Fall with left knee pain.  LEFT KNEE - 1-2 VIEW  Comparison: None.  Findings: No gross fracture or dislocation is identified. Evaluation is limited on the two-view study compared to a complete study.  No joint effusion is seen.  IMPRESSION: No gross injury identified of the left knee on the two-view study.   Original Report Authenticated By: Irish Lack, M.D.    Ct Pelvis Wo Contrast  09/29/2012  *RADIOLOGY REPORT*  Clinical Data: Status post fall; left hip pain.  CT PELVIS WITHOUT CONTRAST  Technique:  Multidetector CT imaging of the pelvis was performed following the standard protocol without intravenous contrast.  Comparison: Left hip radiographs performed earlier today at 09:31 p.m.  Findings: There are mildly comminuted fractures involving the lateral aspect of the left superior pubic ramus, and involving the left inferior pubic ramus.  No additional fractures are seen.  The proximal femurs appear intact bilaterally.  Both femoral heads remains seated at the respective acetabula.  The acetabula appear intact bilaterally. Facet disease is noted at the lower lumbar spine.  There is mild grade 1 anterolisthesis of L4 on L5.  Scattered diverticula are noted along the distal sigmoid colon. There is an unusual  1.1 cm node noted adjacent to the distal sigmoid colon, and additional scattered tiny nodes  are seen.  Would suggest colonoscopy for further evaluation, to exclude an underlying mass.  Remaining visualized small and large bowel loops are grossly unremarkable.  A left common iliac stent graft is noted; patency is not well assessed without contrast.  The uterus is grossly unremarkable in appearance.  The ovaries are grossly symmetric; no suspicious adnexal masses are seen.  A Foley catheter is noted coiled in the bladder; a small amount of air within the bladder likely reflects Foley catheter placement.  IMPRESSION:  1.  Mildly comminuted fractures involving the lateral aspect of the left superior pubic ramus and the left inferior pubic ramus. 2.  Mild degenerative change at the lower lumbar spine. 3.  Unusual 1.1 cm node noted adjacent to the distal sigmoid colon, and additional scattered tiny nodes noted about the distal sigmoid colon.  Would suggest colonoscopy for further evaluation, to exclude an underlying colonic mass. 4.  Scattered diverticulosis along the distal sigmoid colon.   Original Report Authenticated By: Tonia Ghent, M.D.      1. Fracture of pubic ramus       MDM  9:43 PM Left hip and knee xrays pending. Patient will have morphine for pain.   1:11 AM CT shows pubic ramus fracture. Patient will be admitted to hospitalist and Dr. Magnus Ivan will see the patient tomorrow for further evaluation and management.       Emilia Beck, PA-C 09/30/12 [phone]

## 2012-09-29 NOTE — ED Notes (Signed)
KED present upon arrival via EMS.

## 2012-09-29 NOTE — ED Notes (Signed)
Per EMS: Pt was looking up at TV and got dizzy and fell back; pt fell on left hip; 10/10 pain; no deformity noted; pt from home; 22 R wrist; fentanyl given. 161/95 86 96% 20 RR

## 2012-09-29 NOTE — ED Notes (Signed)
transporter given ticket to ride

## 2012-09-30 ENCOUNTER — Encounter (HOSPITAL_COMMUNITY): Payer: Self-pay | Admitting: Internal Medicine

## 2012-09-30 DIAGNOSIS — I10 Essential (primary) hypertension: Secondary | ICD-10-CM

## 2012-09-30 DIAGNOSIS — R109 Unspecified abdominal pain: Secondary | ICD-10-CM

## 2012-09-30 DIAGNOSIS — S329XXA Fracture of unspecified parts of lumbosacral spine and pelvis, initial encounter for closed fracture: Secondary | ICD-10-CM | POA: Diagnosis present

## 2012-09-30 DIAGNOSIS — S32509A Unspecified fracture of unspecified pubis, initial encounter for closed fracture: Principal | ICD-10-CM

## 2012-09-30 DIAGNOSIS — W010XXA Fall on same level from slipping, tripping and stumbling without subsequent striking against object, initial encounter: Secondary | ICD-10-CM

## 2012-09-30 DIAGNOSIS — M5136 Other intervertebral disc degeneration, lumbar region: Secondary | ICD-10-CM | POA: Insufficient documentation

## 2012-09-30 LAB — CBC
HCT: 36.1 % (ref 36.0–46.0)
MCV: 87.8 fL (ref 78.0–100.0)
RDW: 13.7 % (ref 11.5–15.5)
WBC: 6.2 10*3/uL (ref 4.0–10.5)

## 2012-09-30 LAB — URINALYSIS, MICROSCOPIC ONLY
Bilirubin Urine: NEGATIVE
Glucose, UA: NEGATIVE mg/dL
Ketones, ur: NEGATIVE mg/dL
Protein, ur: NEGATIVE mg/dL
Urobilinogen, UA: 0.2 mg/dL (ref 0.0–1.0)

## 2012-09-30 LAB — POCT I-STAT TROPONIN I

## 2012-09-30 LAB — BASIC METABOLIC PANEL
CO2: 22 mEq/L (ref 19–32)
Chloride: 106 mEq/L (ref 96–112)
Creatinine, Ser: 0.68 mg/dL (ref 0.50–1.10)
Glucose, Bld: 130 mg/dL — ABNORMAL HIGH (ref 70–99)

## 2012-09-30 MED ORDER — METHADONE HCL 10 MG PO TABS
5.0000 mg | ORAL_TABLET | Freq: Four times a day (QID) | ORAL | Status: DC
  Administered 2012-09-30 – 2012-10-02 (×9): 5 mg via ORAL
  Filled 2012-09-30 (×9): qty 1

## 2012-09-30 MED ORDER — ALUM & MAG HYDROXIDE-SIMETH 200-200-20 MG/5ML PO SUSP: 30.0000 mL | Freq: Four times a day (QID) | ORAL | Status: DC | PRN

## 2012-09-30 MED ORDER — MAGNESIUM OXIDE 400 (241.3 MG) MG PO TABS
400.0000 mg | ORAL_TABLET | Freq: Every day | ORAL | Status: DC
  Administered 2012-09-30 – 2012-10-02 (×3): 400 mg via ORAL
  Filled 2012-09-30 (×3): qty 1

## 2012-09-30 MED ORDER — PANTOPRAZOLE SODIUM 40 MG PO TBEC
40.0000 mg | DELAYED_RELEASE_TABLET | Freq: Every day | ORAL | Status: DC
  Administered 2012-09-30 – 2012-10-02 (×3): 40 mg via ORAL
  Filled 2012-09-30 (×3): qty 1

## 2012-09-30 MED ORDER — POLYETHYLENE GLYCOL 3350 17 G PO PACK
17.0000 g | PACK | Freq: Every day | ORAL | Status: DC
  Administered 2012-09-30 – 2012-10-01 (×2): 17 g via ORAL
  Filled 2012-09-30 (×2): qty 1

## 2012-09-30 MED ORDER — MAGNESIUM 250 MG PO TABS: 400.0000 mg | ORAL_TABLET | ORAL | Status: DC

## 2012-09-30 MED ORDER — ACETAMINOPHEN 650 MG RE SUPP: 650.0000 mg | Freq: Four times a day (QID) | RECTAL | Status: DC | PRN

## 2012-09-30 MED ORDER — CALCIUM CITRATE-VITAMIN D 315-200 MG-UNIT PO TABS: 1.0000 | ORAL_TABLET | Freq: Two times a day (BID) | ORAL | Status: DC

## 2012-09-30 MED ORDER — ACETAMINOPHEN 325 MG PO TABS
650.0000 mg | ORAL_TABLET | Freq: Four times a day (QID) | ORAL | Status: DC | PRN
  Administered 2012-10-01: 650 mg via ORAL
  Filled 2012-09-30: qty 2

## 2012-09-30 MED ORDER — HYDROMORPHONE HCL PF 1 MG/ML IJ SOLN
0.5000 mg | INTRAMUSCULAR | Status: DC | PRN
  Administered 2012-09-30 – 2012-10-01 (×9): 1 mg via INTRAVENOUS
  Filled 2012-09-30 (×9): qty 1

## 2012-09-30 MED ORDER — LEVOTHYROXINE SODIUM 88 MCG PO TABS
88.0000 ug | ORAL_TABLET | Freq: Every day | ORAL | Status: DC
  Administered 2012-09-30 – 2012-10-02 (×3): 88 ug via ORAL
  Filled 2012-09-30 (×4): qty 1

## 2012-09-30 MED ORDER — IRBESARTAN 75 MG PO TABS
75.0000 mg | ORAL_TABLET | Freq: Every day | ORAL | Status: DC
  Administered 2012-09-30 – 2012-10-02 (×3): 75 mg via ORAL
  Filled 2012-09-30 (×3): qty 1

## 2012-09-30 MED ORDER — PREGABALIN 75 MG PO CAPS
75.0000 mg | ORAL_CAPSULE | Freq: Three times a day (TID) | ORAL | Status: DC
  Administered 2012-09-30 – 2012-10-02 (×10): 75 mg via ORAL
  Filled 2012-09-30 (×10): qty 1

## 2012-09-30 MED ORDER — ONDANSETRON HCL 4 MG PO TABS: 4.0000 mg | ORAL_TABLET | Freq: Four times a day (QID) | ORAL | Status: DC | PRN

## 2012-09-30 MED ORDER — SODIUM CHLORIDE 0.9 % IV SOLN
INTRAVENOUS | Status: DC
  Administered 2012-09-30: 20 mL/h via INTRAVENOUS
  Administered 2012-10-01: 16:00:00 via INTRAVENOUS

## 2012-09-30 MED ORDER — CALCIUM CARBONATE-VITAMIN D 500-200 MG-UNIT PO TABS
1.0000 | ORAL_TABLET | Freq: Two times a day (BID) | ORAL | Status: DC
  Administered 2012-09-30 – 2012-10-02 (×5): 1 via ORAL
  Filled 2012-09-30 (×7): qty 1

## 2012-09-30 MED ORDER — VITAMIN D (ERGOCALCIFEROL) 1.25 MG (50000 UNIT) PO CAPS: 50000.0000 [IU] | ORAL_CAPSULE | ORAL | Status: DC

## 2012-09-30 MED ORDER — OXYCODONE HCL 5 MG PO TABS
5.0000 mg | ORAL_TABLET | ORAL | Status: DC | PRN
  Administered 2012-09-30 – 2012-10-02 (×9): 5 mg via ORAL
  Filled 2012-09-30 (×9): qty 1

## 2012-09-30 MED ORDER — DOCUSATE SODIUM 100 MG PO CAPS
100.0000 mg | ORAL_CAPSULE | Freq: Every day | ORAL | Status: DC
  Administered 2012-09-30: 100 mg via ORAL
  Filled 2012-09-30: qty 1

## 2012-09-30 MED ORDER — ENOXAPARIN SODIUM 40 MG/0.4ML ~~LOC~~ SOLN
40.0000 mg | SUBCUTANEOUS | Status: DC
  Administered 2012-09-30 – 2012-10-02 (×3): 40 mg via SUBCUTANEOUS
  Filled 2012-09-30 (×3): qty 0.4

## 2012-09-30 MED ORDER — ZOLPIDEM TARTRATE 5 MG PO TABS: 5.0000 mg | ORAL_TABLET | Freq: Every evening | ORAL | Status: DC | PRN

## 2012-09-30 MED ORDER — WHITE PETROLATUM GEL
Status: AC
  Administered 2012-09-30: 0.2
  Filled 2012-09-30: qty 5

## 2012-09-30 MED ORDER — ONDANSETRON HCL 4 MG/2ML IJ SOLN: 4.0000 mg | Freq: Four times a day (QID) | INTRAMUSCULAR | Status: DC | PRN

## 2012-09-30 MED ORDER — DOCUSATE SODIUM 100 MG PO CAPS
100.0000 mg | ORAL_CAPSULE | Freq: Two times a day (BID) | ORAL | Status: DC
  Administered 2012-09-30 – 2012-10-02 (×4): 100 mg via ORAL
  Filled 2012-09-30 (×3): qty 1

## 2012-09-30 NOTE — Consult Note (Signed)
Reason for Consult:  Pelvic fractures (left pubic rami) Referring Physician:  ER MD  DANILLE OPPEDISANO is an 71 y.o. female.  HPI:   71 yo female on chronic Lyrica and Methadone sustained a mechanical fall at home yesterday when she got light-headed.  Brought to [facility] with difficulty ambulating and left hip pain.  A CT scan shows pelvic rami fractures.  She is admitted for pain control and therapy.  Past Medical History  Diagnosis Date  . DDD (degenerative disc disease), lumbar   . Hypertension   . History of DVT of lower extremity     Left Lower Extremity  . Chronic pain     Past Surgical History  Procedure Date  . Back surgery   . Thrombectomy w/ embolectomy   . S/p ivc filter placement     Family History  Problem Relation Age of Onset  . CAD Father   . CAD Mother   . Hypertension Mother   . Diabetes Father   . Diabetes Mother   . Congestive Heart Failure Mother     Social History:  reports that she has never smoked. She does not have any smokeless tobacco history on file. She reports that she does not drink alcohol. Her drug history not on file.  Allergies:  Allergies  Allergen Reactions  . Amoxicillin Rash    Medications: I have reviewed the patient's current medications.  Results for orders placed during the hospital encounter of 09/29/12 (from the past 48 hour(s))  CBC WITH DIFFERENTIAL     Status: Abnormal   Collection Time   09/29/12 10:00 PM      Component Value Range Comment   WBC 7.6  4.0 - 10.5 K/uL    RBC 4.10  3.87 - 5.11 MIL/uL    Hemoglobin 11.4 (*) 12.0 - 15.0 g/dL    HCT 21.3  08.6 - 57.8 %    MCV 88.0  78.0 - 100.0 fL    MCH 27.8  26.0 - 34.0 pg    MCHC 31.6  30.0 - 36.0 g/dL    RDW 46.9  62.9 - 52.8 %    Platelets 143 (*) 150 - 400 K/uL    Neutrophils Relative 73  43 - 77 %    Neutro Abs 5.6  1.7 - 7.7 K/uL    Lymphocytes Relative 19  12 - 46 %    Lymphs Abs 1.4  0.7 - 4.0 K/uL    Monocytes Relative 6  3 - 12 %    Monocytes Absolute  0.4  0.1 - 1.0 K/uL    Eosinophils Relative 2  0 - 5 %    Eosinophils Absolute 0.1  0.0 - 0.7 K/uL    Basophils Relative 1  0 - 1 %    Basophils Absolute 0.1  0.0 - 0.1 K/uL   BASIC METABOLIC PANEL     Status: Abnormal   Collection Time   09/29/12 10:00 PM      Component Value Range Comment   Sodium 135  135 - 145 mEq/L    Potassium 3.6  3.5 - 5.1 mEq/L    Chloride 102  96 - 112 mEq/L    CO2 26  19 - 32 mEq/L    Glucose, Bld 146 (*) 70 - 99 mg/dL    BUN 15  6 - 23 mg/dL    Creatinine, Ser 4.13  0.50 - 1.10 mg/dL    Calcium 9.6  8.4 - 24.4 mg/dL    GFR  calc non Af Amer 66 (*) >90 mL/min    GFR calc Af Amer 76 (*) >90 mL/min   PROTIME-INR     Status: Normal   Collection Time   09/29/12 10:00 PM      Component Value Range Comment   Prothrombin Time 14.3  11.6 - 15.2 seconds    INR 1.13  0.00 - 1.49   APTT     Status: Normal   Collection Time   09/29/12 10:00 PM      Component Value Range Comment   aPTT 35  24 - 37 seconds   POCT I-STAT TROPONIN I     Status: Normal   Collection Time   09/30/12  1:22 AM      Component Value Range Comment   Troponin i, poc 0.02  0.00 - 0.08 ng/mL    Comment 3            URINALYSIS, MICROSCOPIC ONLY     Status: Abnormal   Collection Time   09/30/12  3:35 AM      Component Value Range Comment   Color, Urine YELLOW  YELLOW    APPearance CLOUDY (*) CLEAR    Specific Gravity, Urine 1.015  1.005 - 1.030    pH 8.0  5.0 - 8.0    Glucose, UA NEGATIVE  NEGATIVE mg/dL    Hgb urine dipstick TRACE (*) NEGATIVE    Bilirubin Urine NEGATIVE  NEGATIVE    Ketones, ur NEGATIVE  NEGATIVE mg/dL    Protein, ur NEGATIVE  NEGATIVE mg/dL    Urobilinogen, UA 0.2  0.0 - 1.0 mg/dL    Nitrite NEGATIVE  NEGATIVE    Leukocytes, UA NEGATIVE  NEGATIVE    WBC, UA 0-2  <3 WBC/hpf    RBC / HPF 0-2  <3 RBC/hpf    Bacteria, UA RARE  RARE    Squamous Epithelial / LPF RARE  RARE    Urine-Other AMORPHOUS URATES/PHOSPHATES     BASIC METABOLIC PANEL     Status: Abnormal    Collection Time   09/30/12  5:10 AM      Component Value Range Comment   Sodium 140  135 - 145 mEq/L    Potassium 3.5  3.5 - 5.1 mEq/L    Chloride 106  96 - 112 mEq/L    CO2 22  19 - 32 mEq/L    Glucose, Bld 130 (*) 70 - 99 mg/dL    BUN 12  6 - 23 mg/dL    Creatinine, Ser 4.09  0.50 - 1.10 mg/dL    Calcium 9.3  8.4 - 81.1 mg/dL    GFR calc non Af Amer 85 (*) >90 mL/min    GFR calc Af Amer >90  >90 mL/min   CBC     Status: Abnormal   Collection Time   09/30/12  5:10 AM      Component Value Range Comment   WBC 6.2  4.0 - 10.5 K/uL    RBC 4.11  3.87 - 5.11 MIL/uL    Hemoglobin 11.9 (*) 12.0 - 15.0 g/dL    HCT 91.4  78.2 - 95.6 %    MCV 87.8  78.0 - 100.0 fL    MCH 29.0  26.0 - 34.0 pg    MCHC 33.0  30.0 - 36.0 g/dL    RDW 21.3  08.6 - 57.8 %    Platelets 148 (*) 150 - 400 K/uL     Dg Hip Complete Left  09/29/2012  *RADIOLOGY  REPORT*  Clinical Data: Fall with left hip pain.  LEFT HIP - COMPLETE 2+ VIEW  Comparison: None.  Findings: No acute fracture or dislocation identified of the left hip. The pelvis is rotated and it is very difficult to evaluate the left hemipelvis for possible fracture.  Left sided iliac venous stents are identified.  IMPRESSION: No evidence of acute hip fracture on the left.  Due to rotation of the pelvis, it is difficult to evaluate the left pubic bones for fracture.   Original Report Authenticated By: Irish Lack, M.D.    Dg Knee 1-2 Views Left  09/29/2012  *RADIOLOGY REPORT*  Clinical Data: Fall with left knee pain.  LEFT KNEE - 1-2 VIEW  Comparison: None.  Findings: No gross fracture or dislocation is identified. Evaluation is limited on the two-view study compared to a complete study.  No joint effusion is seen.  IMPRESSION: No gross injury identified of the left knee on the two-view study.   Original Report Authenticated By: Irish Lack, M.D.    Ct Pelvis Wo Contrast  09/29/2012  *RADIOLOGY REPORT*  Clinical Data: Status post fall; left hip pain.   CT PELVIS WITHOUT CONTRAST  Technique:  Multidetector CT imaging of the pelvis was performed following the standard protocol without intravenous contrast.  Comparison: Left hip radiographs performed earlier today at 09:31 p.m.  Findings: There are mildly comminuted fractures involving the lateral aspect of the left superior pubic ramus, and involving the left inferior pubic ramus.  No additional fractures are seen.  The proximal femurs appear intact bilaterally.  Both femoral heads remains seated at the respective acetabula.  The acetabula appear intact bilaterally. Facet disease is noted at the lower lumbar spine.  There is mild grade 1 anterolisthesis of L4 on L5.  Scattered diverticula are noted along the distal sigmoid colon. There is an unusual 1.1 cm node noted adjacent to the distal sigmoid colon, and additional scattered tiny nodes are seen.  Would suggest colonoscopy for further evaluation, to exclude an underlying mass.  Remaining visualized small and large bowel loops are grossly unremarkable.  A left common iliac stent graft is noted; patency is not well assessed without contrast.  The uterus is grossly unremarkable in appearance.  The ovaries are grossly symmetric; no suspicious adnexal masses are seen.  A Foley catheter is noted coiled in the bladder; a small amount of air within the bladder likely reflects Foley catheter placement.  IMPRESSION:  1.  Mildly comminuted fractures involving the lateral aspect of the left superior pubic ramus and the left inferior pubic ramus. 2.  Mild degenerative change at the lower lumbar spine. 3.  Unusual 1.1 cm node noted adjacent to the distal sigmoid colon, and additional scattered tiny nodes noted about the distal sigmoid colon.  Would suggest colonoscopy for further evaluation, to exclude an underlying colonic mass. 4.  Scattered diverticulosis along the distal sigmoid colon.   Original Report Authenticated By: Tonia Ghent, M.D.     ROS Blood pressure  136/70, pulse 68, temperature 98.2 F (36.8 C), temperature source Oral, resp. rate 18, height 5\' 8"  (1.727 m), weight 71.668 kg (158 lb), SpO2 92.00%. Physical Exam  Musculoskeletal:       Left hip: She exhibits decreased range of motion and bony tenderness.  Pelvis/pubis stable to compression but painful  Assessment/Plan: Stable left-sided pelvic (pubic rami fractures) 1) this type of pelvic fracture is a stable injury that does not need any surgery.  PT/OT has been ordered for mobility, ADL's, WBAT  Meha Vidrine Y 09/30/2012, 9:23 AM

## 2012-09-30 NOTE — Evaluation (Signed)
Occupational Therapy Evaluation Patient Details Name: [name] MRN: [id number] DOB: [birth year] Today's Date: 09/30/2012 Time: 1478-2956 OT Time Calculation (min): 34 min  OT Assessment / Plan / Recommendation Clinical Impression  Pt admitted with left pubic rami fxs s/p fall at home.  Hx of chronic pain.  Pt extremely limited with all movement due to pelvic pain.  Will continue to follow acute to address below problem list. Recommending SNF for d/c to further progress independence with ADLs and functional mobility before eventual return home.    OT Assessment  Patient needs continued OT Services    Follow Up Recommendations  SNF    Barriers to Discharge      Equipment Recommendations  None recommended by OT    Recommendations for Other Services    Frequency  Min 2X/week    Precautions / Restrictions Precautions Precautions: Fall Restrictions Weight Bearing Restrictions: No   Pertinent Vitals/Pain See vitals    ADL  Eating/Feeding: Performed;Independent Where Assessed - Eating/Feeding: Bed level Grooming: Performed;Wash/dry face;Set up Where Assessed - Grooming: Supine, head of bed up Upper Body Bathing: Simulated;Set up Where Assessed - Upper Body Bathing: Supine, head of bed up Lower Body Bathing: Simulated;+1 Total assistance Where Assessed - Lower Body Bathing: Supine, head of bed up Upper Body Dressing: Performed;Minimal assistance Where Assessed - Upper Body Dressing: Supine, head of bed up Lower Body Dressing: Performed;+1 Total assistance Where Assessed - Lower Body Dressing: Supine, head of bed up ADL Comments: Pt extremely limited by pain.    OT Diagnosis: Acute pain;Generalized weakness  OT Problem List: Decreased activity tolerance;Pain;Decreased knowledge of use of DME or AE OT Treatment Interventions: Self-care/ADL training;Therapeutic exercise;DME and/or AE instruction;Therapeutic activities;Patient/family education   OT Goals Acute Rehab OT  Goals OT Goal Formulation: With patient/family Time For Goal Achievement: 10/14/12 Potential to Achieve Goals: Good ADL Goals Pt Will Perform Lower Body Bathing: Sit to stand from chair;Sit to stand from bed;Supported;with adaptive equipment;with min assist ADL Goal: Lower Body Bathing - Progress: Goal set today Pt Will Perform Lower Body Dressing: Sit to stand from chair;Sit to stand from bed;with adaptive equipment;Supported;with min assist ADL Goal: Lower Body Dressing - Progress: Goal set today Pt Will Transfer to Toilet: with min assist;Ambulation;with DME;Comfort height toilet ADL Goal: Toilet Transfer - Progress: Goal set today Pt Will Perform Toileting - Clothing Manipulation: with min assist;Sitting on 3-in-1 or toilet;Standing ADL Goal: Toileting - Clothing Manipulation - Progress: Goal set today Pt Will Perform Toileting - Hygiene: with min assist;Sit to stand from 3-in-1/toilet ADL Goal: Toileting - Hygiene - Progress: Goal set today Arm Goals Pt Will Complete Theraband Exer: Independently;Bilateral upper extremities;to increase strength;10 reps;Level 2 Theraband Arm Goal: Theraband Exercises - Progress: Goal set today Miscellaneous OT Goals Miscellaneous OT Goal #1: Pt will perform bed mobility with min assist as precursor for EOB ADLs. OT Goal: Miscellaneous Goal #1 - Progress: Goal set today Miscellaneous OT Goal #2: Pt will tolerate sitting EOB >5 min with supervision as precursor for EOB ADLs. OT Goal: Miscellaneous Goal #2 - Progress: Goal set today  Visit Information  Last OT Received On: 09/30/12 Assistance Needed: +2 PT/OT Co-Evaluation/Treatment: Yes    Subjective Data      Prior Functioning     Home Living Lives With: Spouse Available Help at Discharge: Family;Available 24 hours/day Type of Home: House Home Access: Stairs to enter [employer] of Steps: 8 Entrance Stairs-Rails: Right Home Layout: One level Bathroom Shower/Tub: Architectural technologist: Handicapped height Bathroom  Accessibility: Yes How Accessible: Accessible via walker Home Adaptive Equipment: Walker - rolling;Bedside commode/3-in-1;Shower chair without back;Sock aid;Reacher Prior Function Level of Independence: Independent Able to Take Stairs?: Yes Driving: Yes Vocation: Retired Musician: No difficulties Dominant Hand: Right         Vision/Perception     Cognition  Overall Cognitive Status: Appears within functional limits for tasks assessed/performed Arousal/Alertness: Awake/alert Orientation Level: Appears intact for tasks assessed Behavior During Session: [facility] for tasks performed    Extremity/Trunk Assessment Right Upper Extremity Assessment RUE ROM/Strength/Tone: [facility] for tasks assessed Left Upper Extremity Assessment LUE ROM/Strength/Tone: WFL for tasks assessed Right Lower Extremity Assessment RLE ROM/Strength/Tone: Unable to fully assess;Due to pain Left Lower Extremity Assessment LLE ROM/Strength/Tone: Unable to fully assess;Due to pain     Mobility Bed Mobility Bed Mobility: Supine to Sit;Sitting - Scoot to Edge of Bed;Sit to Supine Supine to Sit: 1: +2 Total assist;HOB elevated Supine to Sit: Patient Percentage: 20% Sitting - Scoot to Edge of Bed: 1: +2 Total assist Sitting - Scoot to Edge of Bed: Patient Percentage: 0% Sit to Supine: 1: +2 Total assist;HOB elevated Sit to Supine: Patient Percentage: 20% Details for Bed Mobility Assistance: VC for proper sequencing. Pt unable to tolerate much mobility of pelvis, maintained stability through assist of bilateral LEs. Assist x 2 at trunk for support. Slow movement in/out of bed      Shoulder Instructions     Exercise     Balance Balance Balance Assessed: Yes Static Sitting Balance Static Sitting - Balance Support: Bilateral upper extremity supported;Feet supported Static Sitting - Level of Assistance: 4: Min assist Static Sitting - Comment/#  of Minutes: Sat EOB 1-2 minutes.    End of Session OT - End of Session Activity Tolerance: Patient limited by pain Patient left: in bed;with call bell/phone within reach;with family/visitor present Nurse Communication: Mobility status  GO    09/30/2012 Cipriano Mile OTR/L Pager [phone] Office [phone]  Cipriano Mile 09/30/2012, 11:44 AM

## 2012-09-30 NOTE — Evaluation (Signed)
Physical Therapy Evaluation Patient Details Name: [name] MRN: [id number] DOB: Oct 03, 1941 Today's Date: 09/30/2012 Time: 4098-1191 PT Time Calculation (min): 36 min  PT Assessment / Plan / Recommendation Clinical Impression  Pt s/p fall with pelvic fx. Pt with history of chronic pain. Pt unable to tolerate any movement of pelvis without screaming pain therefore evaluation limited. DIscussed option of SNF for additional rehab prior to d/c home. Pt will benefit from skilled PT in the acute care setting in order to maximize functional mobility and address the above deficits    PT Assessment  Patient needs continued PT services    Follow Up Recommendations  SNF;Supervision/Assistance - 24 hour    Does the patient have the potential to tolerate intense rehabilitation      Barriers to Discharge        Equipment Recommendations  Rolling walker with 5" wheels    Recommendations for Other Services     Frequency Min 5X/week    Precautions / Restrictions Precautions Precautions: Fall Restrictions Weight Bearing Restrictions: No   Pertinent Vitals/Pain Pain 10/10. Pain meds given prior to session. RN aware.       Mobility  Bed Mobility Bed Mobility: Supine to Sit;Sitting - Scoot to Edge of Bed;Sit to Supine Supine to Sit: 1: +2 Total assist;HOB elevated Supine to Sit: Patient Percentage: 20% Sitting - Scoot to Edge of Bed: 1: +2 Total assist Sitting - Scoot to Edge of Bed: Patient Percentage: 0% Sit to Supine: 1: +2 Total assist;HOB elevated Sit to Supine: Patient Percentage: 20% Details for Bed Mobility Assistance: VC for proper sequencing. Pt unable to tolerate much mobility of pelvis, maintained stability through assist of bilateral LEs. Assist x 2 at trunk for support. Slow movement in/out of bed  Transfers Transfers: Not assessed Ambulation/Gait Ambulation/Gait Assistance: Not tested (comment)    Shoulder Instructions     Exercises     PT Diagnosis: Acute  pain;Difficulty walking  PT Problem List: Pain;Decreased strength;Decreased activity tolerance;Decreased mobility PT Treatment Interventions: DME instruction;Functional mobility training;Therapeutic activities;Therapeutic exercise;Patient/family education   PT Goals Acute Rehab PT Goals PT Goal Formulation: With patient Time For Goal Achievement: 10/07/12 Potential to Achieve Goals: Fair Pt will go Supine/Side to Sit: with min assist PT Goal: Supine/Side to Sit - Progress: Goal set today Pt will go Sit to Supine/Side: with min assist PT Goal: Sit to Supine/Side - Progress: Goal set today Pt will go Sit to Stand: with min assist PT Goal: Sit to Stand - Progress: Goal set today Pt will go Stand to Sit: with min assist PT Goal: Stand to Sit - Progress: Goal set today Pt will Transfer Bed to Chair/Chair to Bed: with min assist PT Transfer Goal: Bed to Chair/Chair to Bed - Progress: Goal set today  Visit Information  Last PT Received On: 09/30/12 Assistance Needed: +2 PT/OT Co-Evaluation/Treatment: Yes    Subjective Data  Patient Stated Goal: to decrease pain and be bale to move   Prior Functioning  Home Living Lives With: Spouse Available Help at Discharge: Family;Available 24 hours/day Type of Home: House Home Access: Stairs to enter [employer] of Steps: 8 Entrance Stairs-Rails: Right Home Layout: One level Bathroom Shower/Tub: Health visitor: Handicapped height Bathroom Accessibility: Yes How Accessible: Accessible via walker Home Adaptive Equipment: Walker - rolling;Bedside commode/3-in-1;Shower chair without back Prior Function Level of Independence: Independent Able to Take Stairs?: Yes Driving: Yes Vocation: Retired Musician: No difficulties Dominant Hand: Right    Cognition  Overall Cognitive Status:  Appears within functional limits for tasks assessed/performed Arousal/Alertness: Awake/alert Orientation Level:  Appears intact for tasks assessed Behavior During Session: [facility] for tasks performed    Extremity/Trunk Assessment Right Lower Extremity Assessment RLE ROM/Strength/Tone: Unable to fully assess;Due to pain Left Lower Extremity Assessment LLE ROM/Strength/Tone: Unable to fully assess;Due to pain   Balance    End of Session PT - End of Session Activity Tolerance: Patient limited by pain Patient left: in bed;with call bell/phone within reach;with family/visitor present Nurse Communication: Mobility status  GP     Milana Kidney 09/30/2012, 10:54 AM  09/30/2012 Milana Kidney DPT PAGER: [phone] OFFICE: [phone]

## 2012-09-30 NOTE — H&P (Addendum)
[facility] History and Physical  [name] GNF:[id number] DOB: January 15, 1941 DOA: 09/29/2012  Referring physician:  PCP: No primary provider on file.  Specialists:   Chief Complaint: increased pain in lower ABD and Hip after Fall  HPI: [name] is a 71 y.o. female who was walking to her kitchen at 6 pm and felt lightheaded and loss her balance and fell onto her left side.   She felt increased pain in her left side and lower ABD.   She was unable to get up because she could not move without excruciating pain.    When she was evaluated in the ED an X-ray revealed a pubic rami fracture.  Orthopedics on call were contacted  by the EDP, and the case was discussed, and admission for pain control was recommended by Orthopedics Dr. Magnus Ivan.      Review of Systems: The patient denies anorexia, fever, weight loss, vision loss, decreased hearing, hoarseness, chest pain, syncope, dyspnea on exertion, peripheral edema, balance deficits, hemoptysis, abdominal pain, nausea, vomiting, diarrhea, constipation, hematemesis, melena, hematochezia, severe indigestion/heartburn, hematuria, incontinence, dysuria, genital sores, muscle weakness, suspicious skin lesions, transient blindness, depression, unusual weight change, abnormal bleeding, enlarged lymph nodes, angioedema, and breast masses.    Past Medical History  Diagnosis Date  . DDD (degenerative disc disease), lumbar   . Hypertension   . History of DVT of lower extremity     Left Lower Extremity  . Chronic pain        Hypothyroid      Osteoporosis   Past Surgical History  Procedure Date  . Back surgery   . Thrombectomy w/ embolectomy   . S/p ivc filter placement      Medications:  HOME MEDS: Prior to Admission medications   Medication Sig Start Date End Date Taking? Authorizing Provider  alendronate (FOSAMAX) 70 MG tablet Take 70 mg by mouth every 7 (seven) days. Takes on Thursday Take with a full glass of water on an empty  stomach.   Yes Historical Provider, MD  Calcium Carbonate-Vitamin D (CALCIUM + D PO) Take 1 tablet by mouth daily.   Yes Historical Provider, MD  docusate sodium (COLACE) 100 MG capsule Take 100 mg by mouth daily.   Yes Historical Provider, MD  levothyroxine (SYNTHROID, LEVOTHROID) 88 MCG tablet Take 88 mcg by mouth daily.   Yes Historical Provider, MD  MAGNESIUM PO Take 1 tablet by mouth daily.   Yes Historical Provider, MD  methadone (DOLOPHINE) 5 MG tablet Take 5 mg by mouth 4 (four) times daily.   Yes Historical Provider, MD  omeprazole (PRILOSEC) 20 MG capsule Take 20 mg by mouth daily.   Yes Historical Provider, MD  polyethylene glycol (MIRALAX / GLYCOLAX) packet Take 17 g by mouth daily.   Yes Historical Provider, MD  pregabalin (LYRICA) 75 MG capsule Take 75 mg by mouth 4 (four) times daily.   Yes Historical Provider, MD  valsartan (DIOVAN) 80 MG tablet Take 80 mg by mouth daily.   Yes Historical Provider, MD  VITAMIN D, ERGOCALCIFEROL, PO Take 1 tablet by mouth daily.   Yes Historical Provider, MD    Allergies:  Allergies  Allergen Reactions  . Amoxicillin Rash    Social History:   reports that she has never smoked. She does not have any smokeless tobacco history on file. She reports that she does not drink alcohol. Her drug history not on file.  Family History: Family History  Problem Relation Age of Onset  . CAD  Father   . CAD Mother   . Hypertension Mother   . Diabetes Father   . Diabetes Mother   . Congestive Heart Failure Mother      Physical Exam:  GEN: Pleasant 71 year old well nourished and well developed Caucasian Female  examined and in discomfort but no acute distress; cooperative with exam Filed Vitals:  09/29/12 2040 09/29/12 2046 09/29/12 2214 BP: 154/63  140/69 Pulse: 73  77 Temp: 98.6 F (37 C)   TempSrc: Oral   Resp:   20 SpO2: 95% 96% 96%  Blood pressure 140/69, pulse 77, temperature 98.6 F (37 C), temperature source Oral, resp. rate 20,  SpO2 96.00%. PSYCH: She is alert and oriented x4; does not appear anxious does not appear depressed; affect is normal HEENT: Normocephalic and Atraumatic, Mucous membranes pink; PERRLA; EOM intact; Fundi:  Benign;  No scleral icterus, Nares: Patent, Oropharynx: Clear, Fair Dentition, Neck:  FROM, no cervical lymphadenopathy nor thyromegaly or carotid bruit; no JVD; Breasts:: Not examined CHEST WALL: No tenderness CHEST: Normal respiration, clear to auscultation bilaterally HEART: Regular rate and rhythm; no murmurs rubs or gallops BACK: No kyphosis or scoliosis; no CVA tenderness ABDOMEN: Positive Bowel Sounds, soft non-tender; no masses, no organomegaly, no pannus; no intertriginous candida. Rectal Exam: Not done EXTREMITIES: No bone or joint deformity; age-appropriate arthropathy of the hands and knees; no cyanosis, clubbing or edema; no ulcerations. Genitalia: not examined PULSES: 2+ and symmetric SKIN: Normal hydration no rash or ulceration CNS: Cranial nerves 2-12 grossly intact no focal neurologic deficit     Labs on Admission:  Basic Metabolic Panel:  Lab 09/29/12 1610  NA 135  K 3.6  CL 102  CO2 26  GLUCOSE 146*  BUN 15  CREATININE 0.86  CALCIUM 9.6  MG --  PHOS --   Liver Function Tests: No results found for this basename: AST:5,ALT:5,ALKPHOS:5,BILITOT:5,PROT:5,ALBUMIN:5 in the last 168 hours No results found for this basename: LIPASE:5,AMYLASE:5 in the last 168 hours No results found for this basename: AMMONIA:5 in the last 168 hours CBC:  Lab 09/29/12 2200  WBC 7.6  NEUTROABS 5.6  HGB 11.4*  HCT 36.1  MCV 88.0  PLT 143*   Cardiac Enzymes: No results found for this basename: CKTOTAL:5,CKMB:5,CKMBINDEX:5,TROPONINI:5 in the last 168 hours  BNP (last 3 results) No results found for this basename: PROBNP:3 in the last 8760 hours CBG: No results found for this basename: GLUCAP:5 in the last 168 hours  Radiological Exams on Admission: Dg Hip Complete  Left  09/29/2012  *RADIOLOGY REPORT*  Clinical Data: Fall with left hip pain.  LEFT HIP - COMPLETE 2+ VIEW  Comparison: None.  Findings: No acute fracture or dislocation identified of the left hip. The pelvis is rotated and it is very difficult to evaluate the left hemipelvis for possible fracture.  Left sided iliac venous stents are identified.  IMPRESSION: No evidence of acute hip fracture on the left.  Due to rotation of the pelvis, it is difficult to evaluate the left pubic bones for fracture.   Original Report Authenticated By: Irish Lack, M.D.    Dg Knee 1-2 Views Left  09/29/2012  *RADIOLOGY REPORT*  Clinical Data: Fall with left knee pain.  LEFT KNEE - 1-2 VIEW  Comparison: None.  Findings: No gross fracture or dislocation is identified. Evaluation is limited on the two-view study compared to a complete study.  No joint effusion is seen.  IMPRESSION: No gross injury identified of the left knee on the two-view study.  Original Report Authenticated By: Irish Lack, M.D.    Ct Pelvis Wo Contrast  09/29/2012  *RADIOLOGY REPORT*  Clinical Data: Status post fall; left hip pain.  CT PELVIS WITHOUT CONTRAST  Technique:  Multidetector CT imaging of the pelvis was performed following the standard protocol without intravenous contrast.  Comparison: Left hip radiographs performed earlier today at 09:31 p.m.  Findings: There are mildly comminuted fractures involving the lateral aspect of the left superior pubic ramus, and involving the left inferior pubic ramus.  No additional fractures are seen.  The proximal femurs appear intact bilaterally.  Both femoral heads remains seated at the respective acetabula.  The acetabula appear intact bilaterally. Facet disease is noted at the lower lumbar spine.  There is mild grade 1 anterolisthesis of L4 on L5.  Scattered diverticula are noted along the distal sigmoid colon. There is an unusual 1.1 cm node noted adjacent to the distal sigmoid colon, and additional  scattered tiny nodes are seen.  Would suggest colonoscopy for further evaluation, to exclude an underlying mass.  Remaining visualized small and large bowel loops are grossly unremarkable.  A left common iliac stent graft is noted; patency is not well assessed without contrast.  The uterus is grossly unremarkable in appearance.  The ovaries are grossly symmetric; no suspicious adnexal masses are seen.  A Foley catheter is noted coiled in the bladder; a small amount of air within the bladder likely reflects Foley catheter placement.  IMPRESSION:  1.  Mildly comminuted fractures involving the lateral aspect of the left superior pubic ramus and the left inferior pubic ramus. 2.  Mild degenerative change at the lower lumbar spine. 3.  Unusual 1.1 cm node noted adjacent to the distal sigmoid colon, and additional scattered tiny nodes noted about the distal sigmoid colon.  Would suggest colonoscopy for further evaluation, to exclude an underlying colonic mass. 4.  Scattered diverticulosis along the distal sigmoid colon.   Original Report Authenticated By: Tonia Ghent, M.D.       Assessment: Active Problems:  * No active hospital problems. *  . Pelvic fracture . Hypertension   DDD   Osteoporosis   Hypothyroid   Chronic Pain    Plan:   Admit to Med/Surg Bed Orthopedics; Dr. Magnus Ivan  Pain Control Check TSH Reconcile Home Medications SCDs for DVT Prophylaxis    Code Status:  FULL CODE Family Communication:  Husband at Bedside Disposition Plan:  TBA  Time spent: 39 Minutes  Ron Parker [facility] Pager [phone]  If 7PM-7AM, please contact night-coverage [email or web address] Password [email or web address] 09/30/2012, 2:07 AM

## 2012-09-30 NOTE — Progress Notes (Signed)
[facility] PROGRESS NOTE  [name] MWU:[id number] DOB: [birth year] DOA: 09/29/2012 PCP: No primary provider on file.  Assessment/Plan: Active Problems:  Pelvic fracture  Fall from other slipping, tripping, or stumbling  Hypertension    1. Pelvic fracture, will order PT, already seen by Dr. Magnus Ivan, nonsurgical management 2. Chronic pain, continue methadone 3. Hypothyroidism, continue Synthroid 4. History of DVT, start Lovenox for DVT prophylaxis 5. Hypertension, stable continue outpatient medications  Code Status: full Family Communication: family updated about patient's clinical progress Disposition Plan:  Likely discharge home tomorrow  Brief narrative: 71 yo female on chronic Lyrica and Methadone sustained a mechanical fall at home yesterday when she got light-headed. Brought to [facility] with difficulty ambulating and left hip pain. A CT scan shows pelvic rami fractures. She is admitted for pain control and therapy   Consultants: Kathryne Hitch, MD   Procedures:  None  Antibiotics:  None  HPI/Subjective: Hemodynamically stable overnight  Objective: Filed Vitals:   09/30/12 0200 09/30/12 0201 09/30/12 0330 09/30/12 0548  BP: 143/66  144/60 136/70  Pulse: 69  70 68  Temp:   97.9 F (36.6 C) 98.2 F (36.8 C)  TempSrc:    Oral  Resp: 13  20 18   Height:   5\' 8"  (1.727 m)   Weight:   71.668 kg (158 lb)   SpO2: 93% 93% 98% 92%    Intake/Output Summary (Last 24 hours) at 09/30/12 1017 Last data filed at 09/30/12 0549  Gross per 24 hour  Intake      0 ml  Output    350 ml  Net   -350 ml    Exam:  HENT:  Head: Atraumatic.  Nose: Nose normal.  Mouth/Throat: Oropharynx is clear and moist.  Eyes: Conjunctivae are normal. Pupils are equal, round, and reactive to light. No scleral icterus.  Neck: Neck supple. No tracheal deviation present.  Cardiovascular: Normal rate, regular rhythm, normal heart sounds and intact distal pulses.    Pulmonary/Chest: Effort normal and breath sounds normal. No respiratory distress.  Abdominal: Soft. Normal appearance and bowel sounds are normal. She exhibits no distension. There is no tenderness.  Musculoskeletal: She exhibits no edema and no tenderness.  Neurological: She is alert. No cranial nerve deficit.    Data Reviewed: Basic Metabolic Panel:  Lab 09/30/12 7253 09/29/12 2200  NA 140 135  K 3.5 3.6  CL 106 102  CO2 22 26  GLUCOSE 130* 146*  BUN 12 15  CREATININE 0.68 0.86  CALCIUM 9.3 9.6  MG -- --  PHOS -- --    Liver Function Tests: No results found for this basename: AST:5,ALT:5,ALKPHOS:5,BILITOT:5,PROT:5,ALBUMIN:5 in the last 168 hours No results found for this basename: LIPASE:5,AMYLASE:5 in the last 168 hours No results found for this basename: AMMONIA:5 in the last 168 hours  CBC:  Lab 09/30/12 0510 09/29/12 2200  WBC 6.2 7.6  NEUTROABS -- 5.6  HGB 11.9* 11.4*  HCT 36.1 36.1  MCV 87.8 88.0  PLT 148* 143*    Cardiac Enzymes: No results found for this basename: CKTOTAL:5,CKMB:5,CKMBINDEX:5,TROPONINI:5 in the last 168 hours BNP (last 3 results) No results found for this basename: PROBNP:3 in the last 8760 hours   CBG: No results found for this basename: GLUCAP:5 in the last 168 hours  No results found for this or any previous visit (from the past 240 hour(s)).   Studies: Dg Hip Complete Left  09/29/2012  *RADIOLOGY REPORT*  Clinical Data: Fall with left hip pain.  LEFT HIP - COMPLETE 2+ VIEW  Comparison: None.  Findings: No acute fracture or dislocation identified of the left hip. The pelvis is rotated and it is very difficult to evaluate the left hemipelvis for possible fracture.  Left sided iliac venous stents are identified.  IMPRESSION: No evidence of acute hip fracture on the left.  Due to rotation of the pelvis, it is difficult to evaluate the left pubic bones for fracture.   Original Report Authenticated By: Irish Lack, M.D.    Dg Knee  1-2 Views Left  09/29/2012  *RADIOLOGY REPORT*  Clinical Data: Fall with left knee pain.  LEFT KNEE - 1-2 VIEW  Comparison: None.  Findings: No gross fracture or dislocation is identified. Evaluation is limited on the two-view study compared to a complete study.  No joint effusion is seen.  IMPRESSION: No gross injury identified of the left knee on the two-view study.   Original Report Authenticated By: Irish Lack, M.D.    Ct Pelvis Wo Contrast  09/29/2012  *RADIOLOGY REPORT*  Clinical Data: Status post fall; left hip pain.  CT PELVIS WITHOUT CONTRAST  Technique:  Multidetector CT imaging of the pelvis was performed following the standard protocol without intravenous contrast.  Comparison: Left hip radiographs performed earlier today at 09:31 p.m.  Findings: There are mildly comminuted fractures involving the lateral aspect of the left superior pubic ramus, and involving the left inferior pubic ramus.  No additional fractures are seen.  The proximal femurs appear intact bilaterally.  Both femoral heads remains seated at the respective acetabula.  The acetabula appear intact bilaterally. Facet disease is noted at the lower lumbar spine.  There is mild grade 1 anterolisthesis of L4 on L5.  Scattered diverticula are noted along the distal sigmoid colon. There is an unusual 1.1 cm node noted adjacent to the distal sigmoid colon, and additional scattered tiny nodes are seen.  Would suggest colonoscopy for further evaluation, to exclude an underlying mass.  Remaining visualized small and large bowel loops are grossly unremarkable.  A left common iliac stent graft is noted; patency is not well assessed without contrast.  The uterus is grossly unremarkable in appearance.  The ovaries are grossly symmetric; no suspicious adnexal masses are seen.  A Foley catheter is noted coiled in the bladder; a small amount of air within the bladder likely reflects Foley catheter placement.  IMPRESSION:  1.  Mildly comminuted  fractures involving the lateral aspect of the left superior pubic ramus and the left inferior pubic ramus. 2.  Mild degenerative change at the lower lumbar spine. 3.  Unusual 1.1 cm node noted adjacent to the distal sigmoid colon, and additional scattered tiny nodes noted about the distal sigmoid colon.  Would suggest colonoscopy for further evaluation, to exclude an underlying colonic mass. 4.  Scattered diverticulosis along the distal sigmoid colon.   Original Report Authenticated By: Tonia Ghent, M.D.     Scheduled Meds:   . calcium-vitamin D  1 tablet Oral BID WC  . docusate sodium  100 mg Oral Daily  . irbesartan  75 mg Oral Daily  . levothyroxine  88 mcg Oral QAC breakfast  . magnesium oxide  400 mg Oral Daily  . methadone  5 mg Oral QID  . pantoprazole  40 mg Oral Daily  . polyethylene glycol  17 g Oral Daily  . pregabalin  75 mg Oral TID AC & HS  . Vitamin D (Ergocalciferol)  50,000 Units Oral Q7 days   Continuous Infusions:   .  sodium chloride 20 mL/hr (09/30/12 0401)    Active Problems:  Pelvic fracture  Fall from other slipping, tripping, or stumbling  Hypertension    Time spent: 40 minutes   [facility]  [facility] Pager [phone]. If 8PM-8AM, please contact night-coverage at [email or web address], password [facility] 09/30/2012, 10:17 AM  LOS: 1 day

## 2012-10-01 MED ORDER — POLYETHYLENE GLYCOL 3350 17 G PO PACK
17.0000 g | PACK | Freq: Two times a day (BID) | ORAL | Status: DC
  Administered 2012-10-02: 17 g via ORAL
  Filled 2012-10-01 (×3): qty 1

## 2012-10-01 MED ORDER — BISACODYL 5 MG PO TBEC
5.0000 mg | DELAYED_RELEASE_TABLET | Freq: Every day | ORAL | Status: DC | PRN
  Filled 2012-10-01: qty 1

## 2012-10-01 NOTE — Clinical Social Work Psychosocial (Signed)
     Clinical Social Work Department BRIEF PSYCHOSOCIAL ASSESSMENT 10/01/2012  Patient:  [name], [name]     Account Number:  [id number]     Admit date:  09/29/2012  Clinical Social Worker:  Hulan Fray  Date/Time:  10/01/2012 11:01 AM  Referred by:  Physician  Date Referred:  09/30/2012 Referred for  SNF Placement   Other Referral:   Interview type:  Patient Other interview type:   spouse- Virgen Belland: [phone]    PSYCHOSOCIAL DATA Living Status:  HUSBAND Admitted from facility:   Level of care:   Primary support name:  Renae Fickle [place] Primary support relationship to patient:  SPOUSE Degree of support available:   supportive    CURRENT CONCERNS Current Concerns  Post-Acute Placement   Other Concerns:    SOCIAL WORK ASSESSMENT / PLAN Clinical Social Worker received referral for SNF placement at discharge. CSW introduced self and explained reason for visit. Patient's spouse was at bedside. CSW provided SNF packet. Per husband, they are agreeable to SNF placement and prefer [facility]. CSW encouraged patient and husband to think of alternative facilities in case their preferred facility does not have availability. CSW explained the SNF process and possibility of co-pays and both patient and spouse verbalized they understood. CSW provided contact information to husband. CSW will complete FL2 for MD's signature and continue to follow and update patient and family when bed offers are made.   Assessment/plan status:  Psychosocial Support/Ongoing Assessment of Needs Other assessment/ plan:   Information/referral to community resources:   SNF packet    PATIENTS/FAMILYS RESPONSE TO PLAN OF CARE: Patient and husband are agreeable to SNF placement with preference for [facility]. Both were appreciative of CSW's visit and assistance with discharge plans.

## 2012-10-01 NOTE — Progress Notes (Signed)
[facility] PROGRESS NOTE  [name] ZOX:[id number] DOB: [birth year] DOA: 09/29/2012 PCP: No primary provider on file.  Assessment/Plan: Active Problems:  Pelvic fracture  Fall from other slipping, tripping, or stumbling  Hypertension    1. Pelvic fracture, will order PT, already seen by Dr. Magnus Ivan, nonsurgical management 2. Chronic pain, continue methadone, requiring Dilaudid frequently 3. Hypothyroidism, continue Synthroid 4. History of DVT, start Lovenox for DVT prophylaxis 5. Hypertension, stable continue outpatient medications Code Status: full  Family Communication: family updated about patient's clinical progress  Disposition Plan: SNF when bed available Brief narrative:  71 yo female on chronic Lyrica and Methadone sustained a mechanical fall at home yesterday when she got light-headed. Brought to [facility] with difficulty ambulating and left hip pain. A CT scan shows pelvic rami fractures. She is admitted for pain control and therapy  Consultants:  Kathryne Hitch, MD  Procedures:  None Antibiotics:  None HPI/Subjective:  Hemodynamically stable overnight   Objective: Filed Vitals:   09/30/12 1344 09/30/12 2145 10/01/12 0542 10/01/12 0938  BP: 134/75 119/64 104/55 111/56  Pulse: 68 64 86 78  Temp: 98.6 F (37 C) 98.6 F (37 C) 97.5 F (36.4 C) 98 F (36.7 C)  TempSrc: Oral Oral  Oral  Resp: 16 17 17 18   Height:      Weight:      SpO2: 98% 95% 96% 96%    Intake/Output Summary (Last 24 hours) at 10/01/12 1014 Last data filed at 10/01/12 0937  Gross per 24 hour  Intake   1220 ml  Output   2650 ml  Net  -1430 ml    Exam:  HENT:  Head: Atraumatic.  Nose: Nose normal.  Mouth/Throat: Oropharynx is clear and moist.  Eyes: Conjunctivae are normal. Pupils are equal, round, and reactive to light. No scleral icterus.  Neck: Neck supple. No tracheal deviation present.  Cardiovascular: Normal rate, regular rhythm, normal heart sounds and  intact distal pulses.  Pulmonary/Chest: Effort normal and breath sounds normal. No respiratory distress.  Abdominal: Soft. Normal appearance and bowel sounds are normal. She exhibits no distension. There is no tenderness.  Musculoskeletal: She exhibits no edema and no tenderness.  Neurological: She is alert. No cranial nerve deficit.    Data Reviewed: Basic Metabolic Panel:  Lab 09/30/12 8119 09/29/12 2200  NA 140 135  K 3.5 3.6  CL 106 102  CO2 22 26  GLUCOSE 130* 146*  BUN 12 15  CREATININE 0.68 0.86  CALCIUM 9.3 9.6  MG -- --  PHOS -- --    Liver Function Tests: No results found for this basename: AST:5,ALT:5,ALKPHOS:5,BILITOT:5,PROT:5,ALBUMIN:5 in the last 168 hours No results found for this basename: LIPASE:5,AMYLASE:5 in the last 168 hours No results found for this basename: AMMONIA:5 in the last 168 hours  CBC:  Lab 09/30/12 0510 09/29/12 2200  WBC 6.2 7.6  NEUTROABS -- 5.6  HGB 11.9* 11.4*  HCT 36.1 36.1  MCV 87.8 88.0  PLT 148* 143*    Cardiac Enzymes: No results found for this basename: CKTOTAL:5,CKMB:5,CKMBINDEX:5,TROPONINI:5 in the last 168 hours BNP (last 3 results) No results found for this basename: PROBNP:3 in the last 8760 hours   CBG: No results found for this basename: GLUCAP:5 in the last 168 hours  No results found for this or any previous visit (from the past 240 hour(s)).   Studies: Dg Hip Complete Left  09/29/2012  *RADIOLOGY REPORT*  Clinical Data: Fall with left hip pain.  LEFT HIP - COMPLETE  2+ VIEW  Comparison: None.  Findings: No acute fracture or dislocation identified of the left hip. The pelvis is rotated and it is very difficult to evaluate the left hemipelvis for possible fracture.  Left sided iliac venous stents are identified.  IMPRESSION: No evidence of acute hip fracture on the left.  Due to rotation of the pelvis, it is difficult to evaluate the left pubic bones for fracture.   Original Report Authenticated By: Irish Lack, M.D.    Dg Knee 1-2 Views Left  09/29/2012  *RADIOLOGY REPORT*  Clinical Data: Fall with left knee pain.  LEFT KNEE - 1-2 VIEW  Comparison: None.  Findings: No gross fracture or dislocation is identified. Evaluation is limited on the two-view study compared to a complete study.  No joint effusion is seen.  IMPRESSION: No gross injury identified of the left knee on the two-view study.   Original Report Authenticated By: Irish Lack, M.D.    Ct Pelvis Wo Contrast  09/29/2012  *RADIOLOGY REPORT*  Clinical Data: Status post fall; left hip pain.  CT PELVIS WITHOUT CONTRAST  Technique:  Multidetector CT imaging of the pelvis was performed following the standard protocol without intravenous contrast.  Comparison: Left hip radiographs performed earlier today at 09:31 p.m.  Findings: There are mildly comminuted fractures involving the lateral aspect of the left superior pubic ramus, and involving the left inferior pubic ramus.  No additional fractures are seen.  The proximal femurs appear intact bilaterally.  Both femoral heads remains seated at the respective acetabula.  The acetabula appear intact bilaterally. Facet disease is noted at the lower lumbar spine.  There is mild grade 1 anterolisthesis of L4 on L5.  Scattered diverticula are noted along the distal sigmoid colon. There is an unusual 1.1 cm node noted adjacent to the distal sigmoid colon, and additional scattered tiny nodes are seen.  Would suggest colonoscopy for further evaluation, to exclude an underlying mass.  Remaining visualized small and large bowel loops are grossly unremarkable.  A left common iliac stent graft is noted; patency is not well assessed without contrast.  The uterus is grossly unremarkable in appearance.  The ovaries are grossly symmetric; no suspicious adnexal masses are seen.  A Foley catheter is noted coiled in the bladder; a small amount of air within the bladder likely reflects Foley catheter placement.   IMPRESSION:  1.  Mildly comminuted fractures involving the lateral aspect of the left superior pubic ramus and the left inferior pubic ramus. 2.  Mild degenerative change at the lower lumbar spine. 3.  Unusual 1.1 cm node noted adjacent to the distal sigmoid colon, and additional scattered tiny nodes noted about the distal sigmoid colon.  Would suggest colonoscopy for further evaluation, to exclude an underlying colonic mass. 4.  Scattered diverticulosis along the distal sigmoid colon.   Original Report Authenticated By: Tonia Ghent, M.D.     Scheduled Meds:   . calcium-vitamin D  1 tablet Oral BID WC  . docusate sodium  100 mg Oral BID  . enoxaparin  40 mg Subcutaneous Q24H  . irbesartan  75 mg Oral Daily  . levothyroxine  88 mcg Oral QAC breakfast  . magnesium oxide  400 mg Oral Daily  . methadone  5 mg Oral QID  . pantoprazole  40 mg Oral Daily  . polyethylene glycol  17 g Oral Daily  . pregabalin  75 mg Oral TID AC & HS  . Vitamin D (Ergocalciferol)  50,000 Units Oral Q7 days  Continuous Infusions:   . sodium chloride 20 mL/hr (09/30/12 0401)    Active Problems:  Pelvic fracture  Fall from other slipping, tripping, or stumbling  Hypertension    Time spent: 40 minutes   [facility]  [facility] Pager [phone]. If 8PM-8AM, please contact night-coverage at [email or web address], password [facility] 10/01/2012, 10:14 AM  LOS: 2 days         , Requiring very frequent Dilaudi

## 2012-10-01 NOTE — Progress Notes (Signed)
Physical Therapy Treatment Patient Details Name: [name] MRN: [id number] DOB: [birth year] Today's Date: 10/01/2012 Time: 4098-1191 PT Time Calculation (min): 19 min  PT Assessment / Plan / Recommendation Comments on Treatment Session  Patient was limited by pain during session.  Able to transfer to chair and educated pt on importance of upright postures and mobility.    Follow Up Recommendations  SNF;Supervision/Assistance - 24 hour     Does the patient have the potential to tolerate intense rehabilitation     Barriers to Discharge        Equipment Recommendations  Rolling walker with 5" wheels    Recommendations for Other Services    Frequency Min 3X/week   Plan Discharge plan remains appropriate;Frequency needs to be updated    Precautions / Restrictions Precautions Precautions: Fall Restrictions Weight Bearing Restrictions: No   Pertinent Vitals/Pain 5/10 initally; 10/10 during movement    Mobility  Bed Mobility Bed Mobility: Supine to Sit;Sitting - Scoot to Edge of Bed;Sit to Supine Supine to Sit: 1: +2 Total assist;HOB elevated Supine to Sit: Patient Percentage: 20% Sitting - Scoot to Edge of Bed: 1: +2 Total assist Sitting - Scoot to Edge of Bed: Patient Percentage: 0% Sit to Supine: 1: +2 Total assist;HOB elevated Sit to Supine: Patient Percentage: 20% Details for Bed Mobility Assistance: VC for proper sequencing. Pt unable to tolerate much mobility of pelvis, maintained stability through assist of bilateral LEs. Assist x 2 at trunk for support. Slow movement in/out of bed  Transfers Transfers: Sit to Stand;Stand to Sit;Stand Pivot Transfers Sit to Stand: 1: +2 Total assist;From bed Sit to Stand: Patient Percentage: 60% Stand to Sit: 1: +2 Total assist;To chair/3-in-1 Stand to Sit: Patient Percentage: 60% Stand Pivot Transfers: 1: +2 Total assist Stand Pivot Transfers: Patient Percentage: 60% Details for Transfer Assistance: Use of gait belt and chuck  pad to assist in pivot Ambulation/Gait Ambulation/Gait Assistance: Not tested (comment)    Exercises     PT Diagnosis: Acute pain;Difficulty walking  PT Problem List: Pain;Decreased strength;Decreased activity tolerance;Decreased mobility PT Treatment Interventions: DME instruction;Functional mobility training;Therapeutic activities;Therapeutic exercise;Patient/family education   PT Goals Acute Rehab PT Goals PT Goal Formulation: With patient Time For Goal Achievement: 10/07/12 Potential to Achieve Goals: Fair Pt will go Supine/Side to Sit: with min assist PT Goal: Supine/Side to Sit - Progress: Progressing toward goal Pt will go Sit to Supine/Side: with min assist PT Goal: Sit to Supine/Side - Progress: Progressing toward goal Pt will go Sit to Stand: with min assist PT Goal: Sit to Stand - Progress: Progressing toward goal Pt will go Stand to Sit: with min assist PT Goal: Stand to Sit - Progress: Progressing toward goal Pt will Transfer Bed to Chair/Chair to Bed: with min assist PT Transfer Goal: Bed to Chair/Chair to Bed - Progress: Progressing toward goal  Visit Information  Last PT Received On: 10/01/12 Assistance Needed: +2 PT/OT Co-Evaluation/Treatment: Yes    Subjective Data  Subjective: I'm gonna try my best Patient Stated Goal: to decrease pain and be bale to move   Cognition  Overall Cognitive Status: Appears within functional limits for tasks assessed/performed Arousal/Alertness: Awake/alert Orientation Level: Appears intact for tasks assessed Behavior During Session: [facility] for tasks performed    Balance  Static Sitting Balance Static Sitting - Balance Support: Bilateral upper extremity supported;Feet supported Static Sitting - Level of Assistance: 4: Min assist Static Sitting - Comment/# of Minutes: sat EOB but reports significant pain  End of Session PT - End  of Session Equipment Utilized During Treatment: Gait belt Activity Tolerance: Patient limited by  pain Patient left: in bed;with call bell/phone within reach;with family/visitor present Nurse Communication: Mobility status   GP     Fabio Asa 10/01/2012, 4:31 PM Charlotte Crumb, PT DPT  [phone]

## 2012-10-01 NOTE — Clinical Social Work Placement (Addendum)
    Clinical Social Work Department CLINICAL SOCIAL WORK PLACEMENT NOTE 10/01/2012  Patient:  [name], [name]  Account Number:  [id number] Admit date:  09/29/2012  Clinical Social Worker:  Hulan Fray  Date/time:  10/01/2012 11:31 AM  Clinical Social Work is seeking post-discharge placement for this patient at the following level of care:   SKILLED NURSING   (*CSW will update this form in Epic as items are completed)   10/01/2012  Patient/family provided with [facility] Department of Clinical Social Work's list of facilities offering this level of care within the geographic area requested by the patient (or if unable, by the patient's family).  10/01/2012  Patient/family informed of their freedom to choose among providers that offer the needed level of care, that participate in Medicare, Medicaid or managed care program needed by the patient, have an available bed and are willing to accept the patient.  10/01/2012  Patient/family informed of [facility]' ownership interest in [facility], as well as of the fact that they are under no obligation to receive care at this facility.  PASARR submitted to EDS on 10/01/2012 PASARR number received from EDS on 10/01/2012  FL2 transmitted to all facilities in geographic area requested by pt/family on  10/01/2012 FL2 transmitted to all facilities within larger geographic area on   Patient informed that his/her managed care company has contracts with or will negotiate with  certain facilities, including the following:     Patient/family informed of bed offers received:  10/01/12 Patient chooses bed at [facility] Physician recommends and patient chooses bed at    Patient to be transferred to [facility] on 10/02/12   Patient to be transferred to facility by [facility]  The following physician request were entered in Epic:   Additional Comments:

## 2012-10-02 ENCOUNTER — Inpatient Hospital Stay (HOSPITAL_COMMUNITY): Payer: Medicare Other

## 2012-10-02 LAB — CBC
HCT: 36.6 % (ref 36.0–46.0)
Hemoglobin: 12.1 g/dL (ref 12.0–15.0)
MCHC: 33.1 g/dL (ref 30.0–36.0)
RBC: 4.13 MIL/uL (ref 3.87–5.11)
WBC: 6.7 10*3/uL (ref 4.0–10.5)

## 2012-10-02 MED ORDER — OXYCODONE HCL ER 15 MG PO T12A: 15.0000 mg | EXTENDED_RELEASE_TABLET | Freq: Two times a day (BID) | ORAL | Status: DC

## 2012-10-02 MED ORDER — ENOXAPARIN SODIUM 40 MG/0.4ML ~~LOC~~ SOLN: 30.0000 mg | Freq: Two times a day (BID) | SUBCUTANEOUS | Status: DC

## 2012-10-02 MED ORDER — OXYCODONE HCL 10 MG PO TABS: 10.0000 mg | ORAL_TABLET | ORAL | Status: DC | PRN

## 2012-10-02 MED ORDER — METHADONE HCL 5 MG PO TABS: 5.0000 mg | ORAL_TABLET | Freq: Four times a day (QID) | ORAL | Status: DC

## 2012-10-02 MED ORDER — OXYCODONE HCL 5 MG PO TABS
10.0000 mg | ORAL_TABLET | ORAL | Status: DC | PRN
  Administered 2012-10-02: 10 mg via ORAL
  Administered 2012-10-02: 5 mg via ORAL
  Filled 2012-10-02: qty 2
  Filled 2012-10-02: qty 1

## 2012-10-02 MED ORDER — OXYCODONE HCL ER 15 MG PO T12A
15.0000 mg | EXTENDED_RELEASE_TABLET | Freq: Two times a day (BID) | ORAL | Status: DC
  Administered 2012-10-02: 15 mg via ORAL
  Filled 2012-10-02: qty 1

## 2012-10-02 NOTE — Progress Notes (Signed)
Discharge to [facility] via stetcher transported by EMS.

## 2012-10-02 NOTE — Progress Notes (Signed)
Occupational Therapy Treatment Patient Details Name: [name] MRN: [id number] DOB: April 22, 1941 Today's Date: 10/02/2012 Time: 3086-5784 OT Time Calculation (min): 31 min  OT Assessment / Plan / Recommendation Comments on Treatment Session 71 yo female s/p fall with Lt pubic rami fx that is limited by severe pain. Pt demonstrates deficits with ADLs, bed mobility and basic transfers. Continue to recommend SNF for d/c planning    Follow Up Recommendations  SNF    Barriers to Discharge       Equipment Recommendations  Wheelchair (measurements OT);Wheelchair cushion (measurements OT);3 in 1 bedside comode    Recommendations for Other Services    Frequency Min 2X/week   Plan Discharge plan remains appropriate    Precautions / Restrictions Precautions Precautions: Fall Restrictions Weight Bearing Restrictions: No   Pertinent Vitals/Pain Severe 10 out 10 pain during session Pt no longer able to use IV pain medication Pt educated on the need to attempt oral medication due to possible d/c to SNF today    ADL  Eating/Feeding: Set up Where Assessed - Eating/Feeding: Edge of bed (drinking from cup to take medication) Grooming: Wash/dry face;Modified independent Where Assessed - Grooming: Supported sitting (wipe eyes and face s/p crying) Upper Body Bathing: Moderate assistance Where Assessed - Upper Body Bathing: Unsupported sitting Lower Body Bathing: +1 Total assistance Where Assessed - Lower Body Bathing: Supported sit to stand Upper Body Dressing: Moderate assistance Where Assessed - Upper Body Dressing: Unsupported sitting Lower Body Dressing: +1 Total assistance Where Assessed - Lower Body Dressing: Unsupported sit to stand;Supported sit to Pharmacist, hospital: +2 Total assistance (currently with foley) Toilet Transfer: Patient Percentage: 20% Statistician Method: Surveyor, minerals: Raised toilet seat with arms (or 3-in-1 over toilet) Toileting -  Clothing Manipulation and Hygiene: +1 Total assistance Where Assessed - Toileting Clothing Manipulation and Hygiene: Sit to stand from 3-in-1 or toilet Equipment Used: Gait belt Transfers/Ambulation Related to ADLs: Pt completed Rt side stand pivot to chair. pt only moving Lt LE for pivot. Pt unwilling to shift weight onto Lt Le with facilitation of weight shift. Pt screaming the entire transfer and stating "oh it hurts oh". Pt required Total +2 with the chair repositioned closer and directly behind patient. Pt was not able to complete the Rt side turn pivot. Pt demonstrates narrowed base of support with AROM of only Lt LE.  ADL Comments: Pt with extreme pain crying and screaming during session. Pt provided bed mobility with pillow between knees to maintain a shoulder width apart positioning and roll on Rt side. pt reports increased Rt LE pain from back. pt provided a log roll technique to support the back discomfort. Pt anxious with all mobility and posterior leans during session. Pt sitting EOB to take medication with bil UE support. Pt will required A for all adls. Pt requires Min (A) with BIL hand unsupported. Pt positioned in recliner. Pt eager to talk to rep from [facility] for possible admission.     OT Diagnosis:    OT Problem List:   OT Treatment Interventions:     OT Goals Acute Rehab OT Goals OT Goal Formulation: With patient/family Time For Goal Achievement: 10/14/12 Potential to Achieve Goals: Good ADL Goals Pt Will Perform Lower Body Bathing: Sit to stand from chair;Sit to stand from bed;Supported;with adaptive equipment;with min assist Pt Will Perform Lower Body Dressing: Sit to stand from chair;Sit to stand from bed;with adaptive equipment;Supported;with min assist Pt Will Transfer to Toilet: with min assist;Ambulation;with DME;Comfort  height toilet ADL Goal: Toilet Transfer - Progress: Progressing toward goals Pt Will Perform Toileting - Clothing Manipulation: with min  assist;Sitting on 3-in-1 or toilet;Standing ADL Goal: Toileting - Clothing Manipulation - Progress: Progressing toward goals Pt Will Perform Toileting - Hygiene: with min assist;Sit to stand from 3-in-1/toilet Arm Goals Pt Will Complete Theraband Exer: Independently;Bilateral upper extremities;to increase strength;10 reps;Level 2 Theraband Miscellaneous OT Goals Miscellaneous OT Goal #1: Pt will perform bed mobility with min assist as precursor for EOB ADLs. OT Goal: Miscellaneous Goal #1 - Progress: Not progressing Miscellaneous OT Goal #2: Pt will tolerate sitting EOB >5 min with supervision as precursor for EOB ADLs. OT Goal: Miscellaneous Goal #2 - Progress: Progressing toward goals  Visit Information  Last OT Received On: 10/02/12 Assistance Needed: +2    Subjective Data      Prior Functioning       Cognition  Overall Cognitive Status: Appears within functional limits for tasks assessed/performed Arousal/Alertness: Awake/alert Orientation Level: Appears intact for tasks assessed Behavior During Session: [facility] for tasks performed    Mobility  Shoulder Instructions Bed Mobility Bed Mobility: Right Sidelying to Sit Right Sidelying to Sit: 1: +2 Total assist;HOB elevated Right Sidelying to Sit: Patient Percentage: 20% (HOB 20 degrees) Supine to Sit: 1: +2 Total assist;HOB elevated Supine to Sit: Patient Percentage: 20% Sitting - Scoot to Edge of Bed: 3: Mod assist Details for Bed Mobility Assistance: max v/c for proper sequence and calming soft tone of voice to encourage patient reducing anxiety. Pt screaming during transfer. Pt able to stop crying with questioning and answer all questions. pt requires (A) for bil LE and UB . Pt is unable to tolerate EOB >5 minutes. Pt states "this is not comfortable oh I don't know what I want" pt using bil UE support to sit Min guard  Transfers Transfers: Sit to Stand;Stand to Sit Sit to Stand: 1: +2 Total assist;With upper extremity  assist;From bed Sit to Stand: Patient Percentage: 50% Stand to Sit: 1: +2 Total assist;With upper extremity assist;To chair/3-in-1 Stand to Sit: Patient Percentage: 40% Details for Transfer Assistance: Pt provided hand held (A) for sit<>stand. Pt is unable to maintain static standing and requesting to sit. Pt stood static for ~20 seconds and required Chair moved to return to sitting. pt with uncontrolled descend to chair       Exercises      Balance     End of Session OT - End of Session Activity Tolerance: Patient limited by pain Patient left: in chair;with call bell/phone within reach Nurse Communication: Mobility status;Precautions  GO     Lucile Shutters 10/02/2012, 11:29 AM Pager: [phone]

## 2012-10-02 NOTE — Discharge Summary (Signed)
Physician Discharge Summary  [name] MRN: [id number] DOB/AGE: 72-Aug-1942 72 y.o.  PCP: No primary provider on file.   Admit date: 09/29/2012 Discharge date: 10/02/2012  Discharge Diagnoses:     Pelvic fracture  Fall from other slipping, tripping, or stumbling  Hypertension     Medication List     As of 10/02/2012 10:48 AM    TAKE these medications         alendronate 70 MG tablet   Commonly known as: FOSAMAX   Take 70 mg by mouth every 7 (seven) days. Takes on Thursday  Take with a full glass of water on an empty stomach.      CALCIUM + D PO   Take 1 tablet by mouth daily.      docusate sodium 100 MG capsule   Commonly known as: COLACE   Take 100 mg by mouth daily.      enoxaparin 40 MG/0.4ML injection   Commonly known as: LOVENOX   Inject 0.3 mLs (30 mg total) into the skin every 12 (twelve) hours.      levothyroxine 88 MCG tablet   Commonly known as: SYNTHROID, LEVOTHROID   Take 88 mcg by mouth daily.      MAGNESIUM PO   Take 1 tablet by mouth daily.      methadone 5 MG tablet   Commonly known as: DOLOPHINE   Take 1 tablet (5 mg total) by mouth 4 (four) times daily.      omeprazole 20 MG capsule   Commonly known as: PRILOSEC   Take 20 mg by mouth daily.      Oxycodone HCl 10 MG Tabs   Take 1 tablet (10 mg total) by mouth every 4 (four) hours as needed.      polyethylene glycol packet   Commonly known as: MIRALAX / GLYCOLAX   Take 17 g by mouth daily.      pregabalin 75 MG capsule   Commonly known as: LYRICA   Take 75 mg by mouth 4 (four) times daily.      valsartan 80 MG tablet   Commonly known as: DIOVAN   Take 80 mg by mouth daily.      VITAMIN D (ERGOCALCIFEROL) PO   Take 1 tablet by mouth daily.        Discharge Condition: Stable  Disposition:    Consults:     Significant Diagnostic Studies: Dg Hip Complete Left  09/29/2012  *RADIOLOGY REPORT*  Clinical Data: Fall with left hip pain.  LEFT HIP - COMPLETE 2+  VIEW  Comparison: None.  Findings: No acute fracture or dislocation identified of the left hip. The pelvis is rotated and it is very difficult to evaluate the left hemipelvis for possible fracture.  Left sided iliac venous stents are identified.  IMPRESSION: No evidence of acute hip fracture on the left.  Due to rotation of the pelvis, it is difficult to evaluate the left pubic bones for fracture.   Original Report Authenticated By: Irish Lack, M.D.    Dg Knee 1-2 Views Left  09/29/2012  *RADIOLOGY REPORT*  Clinical Data: Fall with left knee pain.  LEFT KNEE - 1-2 VIEW  Comparison: None.  Findings: No gross fracture or dislocation is identified. Evaluation is limited on the two-view study compared to a complete study.  No joint effusion is seen.  IMPRESSION: No gross injury identified of the left knee on the two-view study.   Original Report Authenticated By: Irish Lack, M.D.  Ct Pelvis Wo Contrast  09/29/2012  *RADIOLOGY REPORT*  Clinical Data: Status post fall; left hip pain.  CT PELVIS WITHOUT CONTRAST  Technique:  Multidetector CT imaging of the pelvis was performed following the standard protocol without intravenous contrast.  Comparison: Left hip radiographs performed earlier today at 09:31 p.m.  Findings: There are mildly comminuted fractures involving the lateral aspect of the left superior pubic ramus, and involving the left inferior pubic ramus.  No additional fractures are seen.  The proximal femurs appear intact bilaterally.  Both femoral heads remains seated at the respective acetabula.  The acetabula appear intact bilaterally. Facet disease is noted at the lower lumbar spine.  There is mild grade 1 anterolisthesis of L4 on L5.  Scattered diverticula are noted along the distal sigmoid colon. There is an unusual 1.1 cm node noted adjacent to the distal sigmoid colon, and additional scattered tiny nodes are seen.  Would suggest colonoscopy for further evaluation, to exclude an underlying  mass.  Remaining visualized small and large bowel loops are grossly unremarkable.  A left common iliac stent graft is noted; patency is not well assessed without contrast.  The uterus is grossly unremarkable in appearance.  The ovaries are grossly symmetric; no suspicious adnexal masses are seen.  A Foley catheter is noted coiled in the bladder; a small amount of air within the bladder likely reflects Foley catheter placement.  IMPRESSION:  1.  Mildly comminuted fractures involving the lateral aspect of the left superior pubic ramus and the left inferior pubic ramus. 2.  Mild degenerative change at the lower lumbar spine. 3.  Unusual 1.1 cm node noted adjacent to the distal sigmoid colon, and additional scattered tiny nodes noted about the distal sigmoid colon.  Would suggest colonoscopy for further evaluation, to exclude an underlying colonic mass. 4.  Scattered diverticulosis along the distal sigmoid colon.   Original Report Authenticated By: Tonia Ghent, M.D.        Microbiology: No results found for this or any previous visit (from the past 240 hour(s)).   Labs: Results for orders placed during the hospital encounter of 09/29/12 (from the past 48 hour(s))  CBC     Status: Abnormal   Collection Time   10/02/12  5:45 AM      Component Value Range Comment   WBC 6.7  4.0 - 10.5 K/uL    RBC 4.13  3.87 - 5.11 MIL/uL    Hemoglobin 12.1  12.0 - 15.0 g/dL    HCT 16.1  09.6 - 04.5 %    MCV 88.6  78.0 - 100.0 fL    MCH 29.3  26.0 - 34.0 pg    MCHC 33.1  30.0 - 36.0 g/dL    RDW 40.9  81.1 - 91.4 %    Platelets 140 (*) 150 - 400 K/uL      HPI : [name] is a 71 y.o. female who was walking to her kitchen at 6 pm and felt lightheaded and loss her balance and fell onto her left side. She felt increased pain in her left side and lower ABD. She was unable to get up because she could not move without excruciating pain. When she was evaluated in the ED an X-ray revealed a pubic rami fracture.  Orthopedics on call were contacted by the EDP, and the case was discussed, and admission for pain control was recommended by Orthopedics Dr. Magnus Ivan.    HOSPITAL COURSE:  1. Pelvic fracture, PT recommends SNF, already seen by  Dr. Magnus Ivan, nonsurgical management. Lovenox for DVT prophylaxis for 3 weeks 2. Chronic pain, continue methadone, requiring Dilaudid frequently IV, have switched to oxycodone and continue methadone 3. Hypothyroidism, continue Synthroid 4. History of DVT, start Lovenox for DVT prophylaxis 5. Hypertension, stable continue outpatient medications 6. Fever patient had a fever 101 yesterday, chest x-ray pending, and negative the patient can be discharged to SNF, she should continue with incentive spirometry at the nursing home, CBC in 2 days and then one week Code Status: full     Discharge Exam:  Blood pressure 141/75, pulse 82, temperature 97.8 F (36.6 C), temperature source Oral, resp. rate 18, height 5\' 8"  (1.727 m), weight 71.668 kg (158 lb), SpO2 95.00%.  HENT:  Head: Atraumatic.  Nose: Nose normal.  Mouth/Throat: Oropharynx is clear and moist.  Eyes: Conjunctivae are normal. Pupils are equal, round, and reactive to light. No scleral icterus.  Neck: Neck supple. No tracheal deviation present.  Cardiovascular: Normal rate, regular rhythm, normal heart sounds and intact distal pulses.  Pulmonary/Chest: Effort normal and breath sounds normal. No respiratory distress.  Abdominal: Soft. Normal appearance and bowel sounds are normal. She exhibits no distension. There is no tenderness.  Musculoskeletal: She exhibits no edema and no tenderness.  Neurological: She is alert. No cranial nerve deficit.          Discharge Orders    Future Orders Please Complete By Expires   Diet - low sodium heart healthy      Increase activity slowly         Follow-up Information    Follow up with Kathryne Hitch, MD. In 2 weeks.   Contact information:   [address]  [address] [place] [address] [phone]          Signed: Richarda Overlie 10/02/2012, 10:48 AM

## 2012-10-02 NOTE — Progress Notes (Signed)
Report given to Melodye Ped, LPN.

## 2012-10-18 NOTE — ED Provider Notes (Addendum)
Medical screening examination/treatment/procedure(s) were conducted as a shared visit with non-physician practitioner(s) and myself.  I personally evaluated the patient during the encounter   Loren Racer, MD 10/18/12 0830  Loren Racer, MD 10/19/12 [phone]

## 2013-07-15 ENCOUNTER — Other Ambulatory Visit: Payer: Self-pay | Admitting: Internal Medicine

## 2013-07-15 ENCOUNTER — Ambulatory Visit
Admission: RE | Admit: 2013-07-15 | Discharge: 2013-07-15 | Disposition: A | Discharge status: Home / Self Care | Payer: Medicare Other | Source: Ambulatory Visit | Attending: Internal Medicine | Admitting: Internal Medicine

## 2013-07-15 DIAGNOSIS — R600 Localized edema: Secondary | ICD-10-CM

## 2014-07-04 ENCOUNTER — Encounter: Payer: Self-pay | Admitting: Podiatry

## 2014-07-04 ENCOUNTER — Ambulatory Visit (INDEPENDENT_AMBULATORY_CARE_PROVIDER_SITE_OTHER): Payer: Medicare Other

## 2014-07-04 ENCOUNTER — Ambulatory Visit (INDEPENDENT_AMBULATORY_CARE_PROVIDER_SITE_OTHER): Payer: Medicare Other | Admitting: Podiatry

## 2014-07-04 VITALS — BP 158/90 | HR 56 | Resp 16 | Ht 66.0 in | Wt 160.0 lb

## 2014-07-04 DIAGNOSIS — M722 Plantar fascial fibromatosis: Secondary | ICD-10-CM

## 2014-07-04 MED ORDER — TRIAMCINOLONE ACETONIDE 10 MG/ML IJ SUSP
10.0000 mg | Freq: Once | INTRAMUSCULAR | Status: AC
  Administered 2014-07-04: 10 mg

## 2014-07-04 NOTE — Progress Notes (Signed)
   Subjective:    Patient ID: Tali C HeEthlyn Danielsist, female    DOB: [birth year], 73 y.o.   MRN: [id number]  HPI Comments: My right heel and arch is hurting. Its been hurting for 1 month. The pain is getting worse. It hurts to walk and stand. i went to the ER 9.25.15 and they gave me an injection of dilaudid. They gave me a rx for prednisone that im taking now. i bought new shoes and i  wear a sleeping boot.  Foot Pain Associated symptoms include fatigue, numbness, a rash and weakness.      Review of Systems  Constitutional: Positive for fatigue.  HENT: Positive for sinus pressure.   Gastrointestinal: Positive for constipation.  Endocrine: Positive for cold intolerance and heat intolerance.  Musculoskeletal: Positive for back pain.       Difficulty walking Muscle pain  Skin: Positive for rash.       Thick scars   Neurological: Positive for weakness and numbness.  Hematological: Bruises/bleeds easily.  All other systems reviewed and are negative.      Objective:   Physical Exam        Assessment & Plan:

## 2014-07-04 NOTE — Patient Instructions (Signed)

## 2014-07-05 ENCOUNTER — Telehealth: Payer: Self-pay | Admitting: Podiatry

## 2014-07-05 NOTE — Telephone Encounter (Signed)
Patent called this morning stating she developed a lot of knee pain in her right knee last night. She had a steroid injection into the right plantar fascia yesterday. Pain currently 7/10. Denies any redness/swelling to the knee. She does state she had this happen before after she had a steroid injection into her right hip, she developed knee pain for about 4 days. She has been alternating ice/heat to the area and has limited weight bearing. I recommended that if she continues to have pain, or if symptoms worsen she is to go to urgent care/ER.

## 2014-07-06 NOTE — Progress Notes (Signed)
Subjective:     Patient ID: Meghan DanielsSandra C Gross, female   DOB: 08/06/1941, 73 y.o.   MRN: [id number]  Foot Pain   patient presents stating she's been having a lot of pain for the last month in her plantar right heel. Went to the emergency room where she had an injection which helped her temporarily but the pain has been quite extensive period she hasn't orthotic that over 74 years old which does not give her support and the pain has intensified recently   Review of Systems  All other systems reviewed and are negative.      Objective:   Physical Exam  Nursing note and vitals reviewed. Constitutional: She is oriented to person, place, and time.  Cardiovascular: Intact distal pulses.   Musculoskeletal: Normal range of motion.  Neurological: She is oriented to person, place, and time.  Skin: Skin is warm.   neurovascular status is intact with muscle strength adequate and range of motion subtalar midtarsal joint within normal limits. Patient is noted to have depression of the arch upon weightbearing and moderate equinus condition noted and does have significant discomfort in the plantar heel at the insertion of the calcaneus. I did note that there is good digital perfusion and she is well oriented x3     Assessment:     Severe plantar fasciitis right heel with mechanical dysfunction of foot    Plan:     H&P and x-ray reviewed and today I injected the right plantar fashion 3 mg Kenalog 5 mg Xylocaine and applied fascially brace and scanned for custom orthotics to reduce plantar stresses and replace old orthotics. Reappoint when ready or earlier if any issues should occur

## 2014-07-08 ENCOUNTER — Telehealth: Payer: Self-pay | Admitting: *Deleted

## 2014-07-08 NOTE — Telephone Encounter (Signed)
Pt called stated she was in on Friday. She stated she is in a lot of pain. Is taking percocet every 4 hrs and its not helping. In your note it stated she received a brace and pt states she did not get one. States she bought a [employer] research (medical)nite boot and its not helping. And she said she is icing her foot. i told pt to stay off of foot, elevate and continue with icing. Is there anything you suggest/meds?

## 2014-07-09 ENCOUNTER — Telehealth: Payer: Self-pay | Admitting: *Deleted

## 2014-07-09 NOTE — Telephone Encounter (Signed)
Shalie HUSBAND CALLED AND STATED THAT THEY WERE AT THE [employer]OE [employer] AND WANTED TO CANCEL THE ORDER FOR ORTHOTICS, I EXPLAINED TO [name] THAT THE ORTHOTICS ARE CUSTOM MADE TO HER FOOT AND THE ORDER WAS ALMOST COMPLETE AND THAT WE COULD NOT CANCEL THE ORDER. SHE ALSO WAS CONCERNED ABOUT ORTHOTICS FITTING INTO HER SHOES AND THAT SHE WAS BUYING NEW SHOES. TOLD PATIENT TO HOLD OFF ON BUYING NEW SHOES UNTIL SHE GOT HER ORTHOTICS THAT WAY  SHE COULD TAKE ORTHOTICS WITH HER AND FIT PROPERLY.

## 2014-07-11 ENCOUNTER — Telehealth: Payer: Self-pay | Admitting: *Deleted

## 2014-07-11 NOTE — Telephone Encounter (Signed)
CALLED AND SPOKE WITH PT LETTING HER KNOW SHE SHOULD OF RECEIVED A BRACE. PT STATES SHE DID NOT RECEIVE ONE. PER DR REGAL WE CAN GIVE HER A CAM WALKER. PT STATED SHE WOULD GO BY [facility] OFFICE ON Monday MORNING TO PICK UP BRACE AND OR CAM WALKER.

## 2014-07-11 NOTE — Telephone Encounter (Signed)
Should have received a fascial brace. Could consider a cam wallker

## 2014-07-14 ENCOUNTER — Ambulatory Visit: Payer: Medicare Other | Admitting: Podiatry

## 2014-07-14 DIAGNOSIS — M722 Plantar fascial fibromatosis: Secondary | ICD-10-CM

## 2014-07-18 ENCOUNTER — Telehealth: Payer: Self-pay | Admitting: *Deleted

## 2014-07-18 NOTE — Telephone Encounter (Signed)
I'm calling to see if my orthotics is in yet that had been ordered.  I'm just checking to see if it is here.  Thank you.

## 2014-07-21 NOTE — Telephone Encounter (Signed)
I called the patient.  Her husband stated she was not available and asked who is calling.  I informed him I was returning her call from the [facility].  He stated, "We've already taken care of this.  She has an appointment next Thursday at 9:30am.  Is that correct?"  I told him she is scheduled for the 23rd at 9:30am.  He asked, "What's the cost of the appointment going to be?"  I told him I am not sure, I only answer patient calls.  He asked, "The orthotics have been there how long, a week and a half?"  I told him the orthotics came in on 07/16/2014.  He stated, "Oh, okay"

## 2014-07-25 ENCOUNTER — Other Ambulatory Visit: Payer: Medicare Other

## 2014-08-04 ENCOUNTER — Encounter: Payer: Self-pay | Admitting: Podiatry

## 2014-08-04 ENCOUNTER — Ambulatory Visit (INDEPENDENT_AMBULATORY_CARE_PROVIDER_SITE_OTHER): Payer: Medicare Other | Admitting: Podiatry

## 2014-08-04 DIAGNOSIS — M722 Plantar fascial fibromatosis: Secondary | ICD-10-CM

## 2014-08-04 NOTE — Patient Instructions (Signed)

## 2014-08-05 NOTE — Progress Notes (Signed)
Subjective:     Patient ID: [name], female   DOB: 07-20-1941, 73 y.o.   MRN: [id number]  HPIpatient presents with right foot stating that it is still giving her trouble but better than it was previously   Review of Systems     Objective:   Physical Exam Neurovascular status is intact with no other changes noted and discomfort of a mild to moderate nature plantar heel    Assessment:     Plantar fasciitis right improving    Plan:     Advised on physical therapy anti-inflammatories and dispensed orthotics with usage. Utilized boot as appropriate

## 2014-09-15 ENCOUNTER — Encounter: Payer: Self-pay | Admitting: Podiatry

## 2014-09-15 ENCOUNTER — Ambulatory Visit (INDEPENDENT_AMBULATORY_CARE_PROVIDER_SITE_OTHER): Payer: Medicare Other | Admitting: Podiatry

## 2014-09-15 VITALS — BP 150/87 | HR 71 | Resp 16

## 2014-09-15 DIAGNOSIS — M722 Plantar fascial fibromatosis: Secondary | ICD-10-CM

## 2014-09-17 NOTE — Progress Notes (Signed)
Subjective:     Patient ID: Meghan DanielsSandra C Sotero, female   DOB: 1941/03/11, 73 y.o.   MRN: [id number]  HPI patient presents stating the bottom of her right heel has been awful and she cannot wear the boot given her problems with her hip and opposite leg. States that the pain seems to be getting quite a bit more intense and that she's having trouble with daily activities   Review of Systems     Objective:   Physical Exam Neurovascular status intact muscle strength adequate with exquisite discomfort plantar aspect right heel at the insertion of the tendon into the calcaneus    Assessment:     Continued frustrating plantar fasciitis that's not responded to conservative care    Plan:     Discussed treatment options including open surgery and shockwave therapy. I would like to avoid open surgery and recommended shockwave and she will think about this and today I did dispense a night splint with instructions on usage to try to stretch the plantar heel reduce it in a nonweightbearing fashion and hopefully prevent surgery. Reappoint for shockwave therapy

## 2014-12-08 ENCOUNTER — Ambulatory Visit
Admission: RE | Admit: 2014-12-08 | Discharge: 2014-12-08 | Disposition: A | Discharge status: Home / Self Care | Payer: Self-pay | Source: Ambulatory Visit | Attending: Internal Medicine | Admitting: Internal Medicine

## 2014-12-08 ENCOUNTER — Other Ambulatory Visit: Payer: Self-pay | Admitting: Internal Medicine

## 2014-12-08 DIAGNOSIS — R609 Edema, unspecified: Secondary | ICD-10-CM

## 2014-12-08 DIAGNOSIS — M79605 Pain in left leg: Secondary | ICD-10-CM

## 2014-12-17 ENCOUNTER — Other Ambulatory Visit: Payer: Self-pay | Admitting: *Deleted

## 2014-12-17 DIAGNOSIS — I872 Venous insufficiency (chronic) (peripheral): Secondary | ICD-10-CM

## 2015-01-19 ENCOUNTER — Encounter: Payer: Medicare Other | Admitting: Surgery

## 2015-01-19 ENCOUNTER — Encounter (HOSPITAL_COMMUNITY): Payer: Medicare Other

## 2015-03-21 IMAGING — US US EXTREM LOW VENOUS*L*
1 series · 13 of 24 positions shown · non-contrast
Comparison: None.

CLINICAL DATA: 73-year-old female with left lower extremity pain
and swelling



[Series 1: us extrem low venous*left* · 13 of 29 slices shown]
[im 1/29]
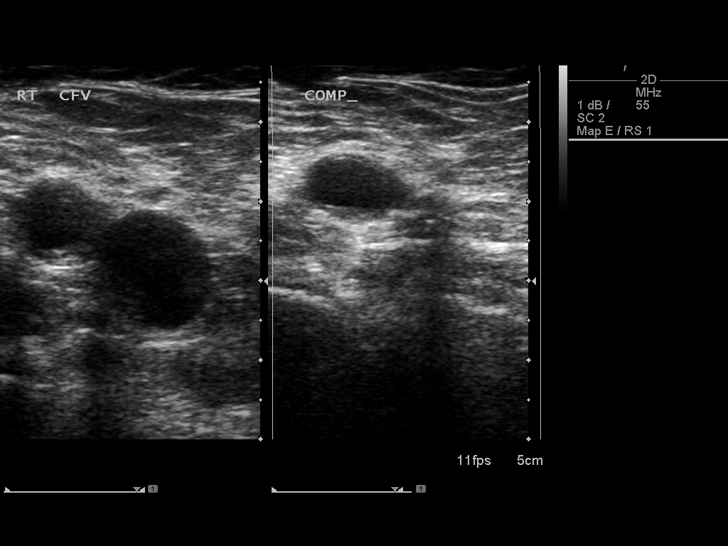
[im 3/29]
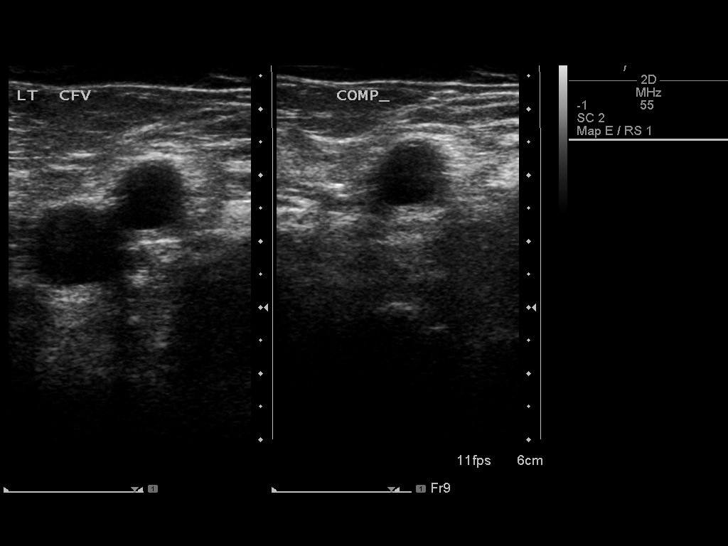
[im 5/29]
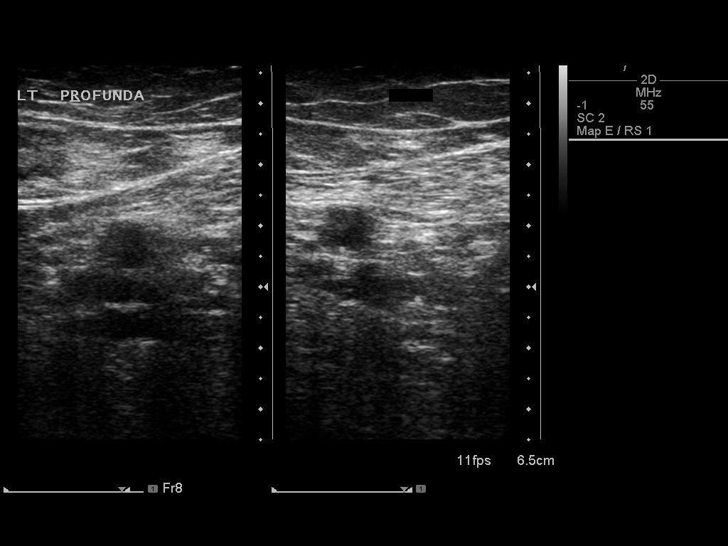
[im 8/29]
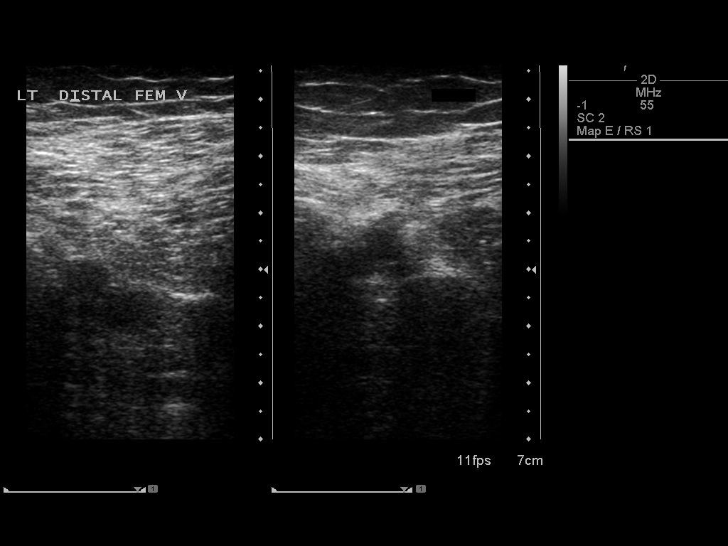
[im 10/29]
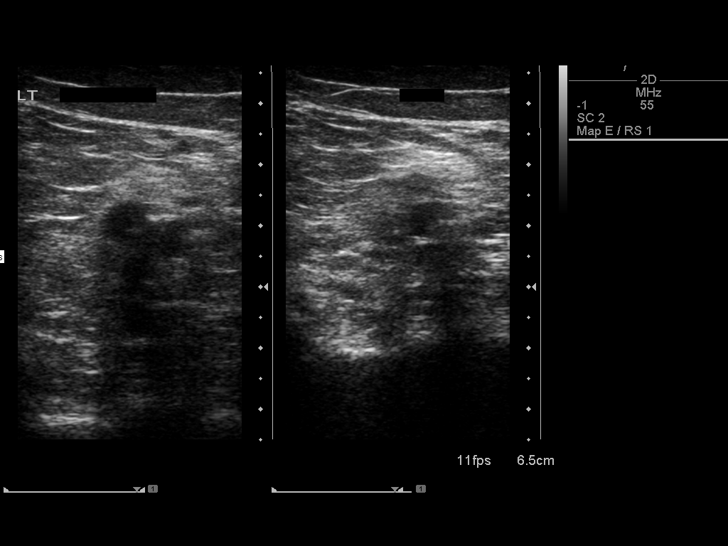
[im 13/29]
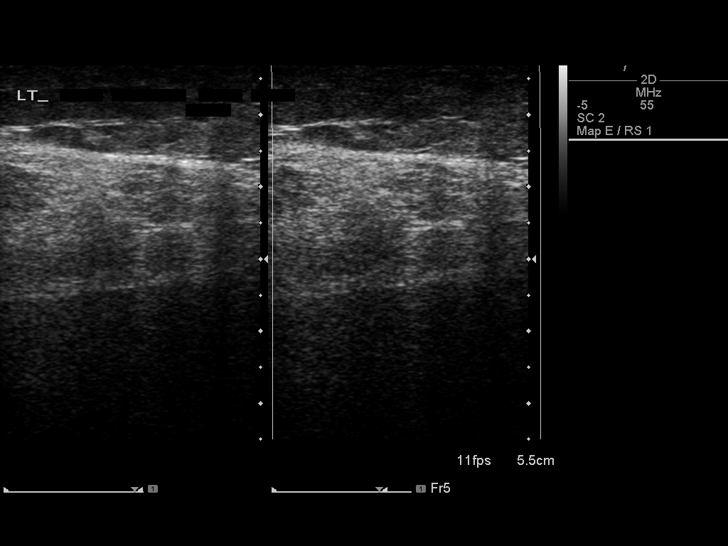
[im 15/29]
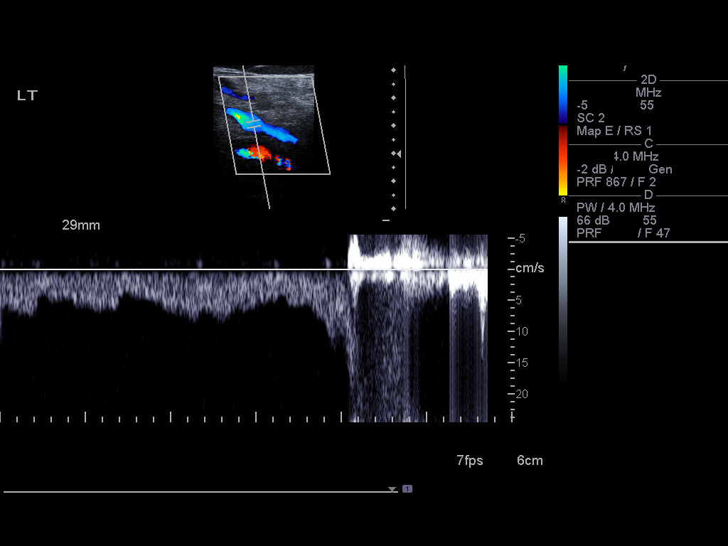
[im 16/29]
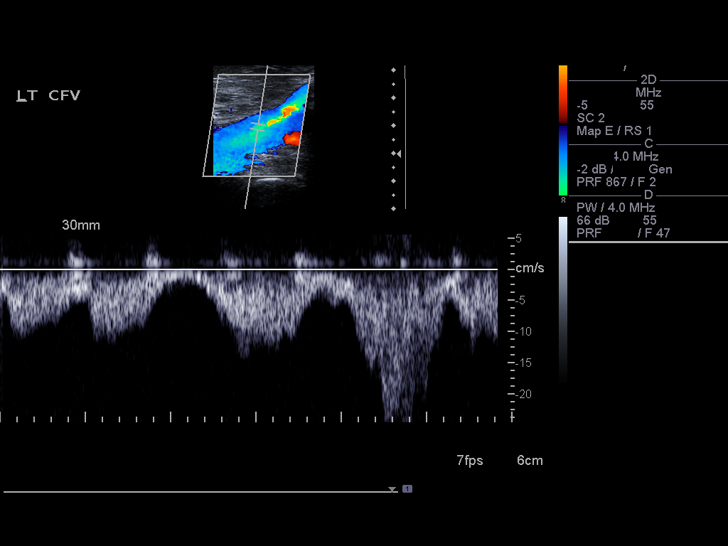
[im 19/29]
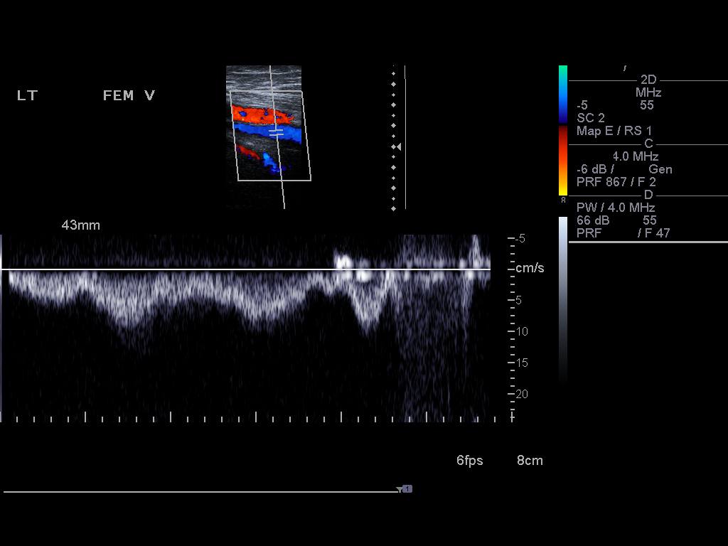
[im 21/29]
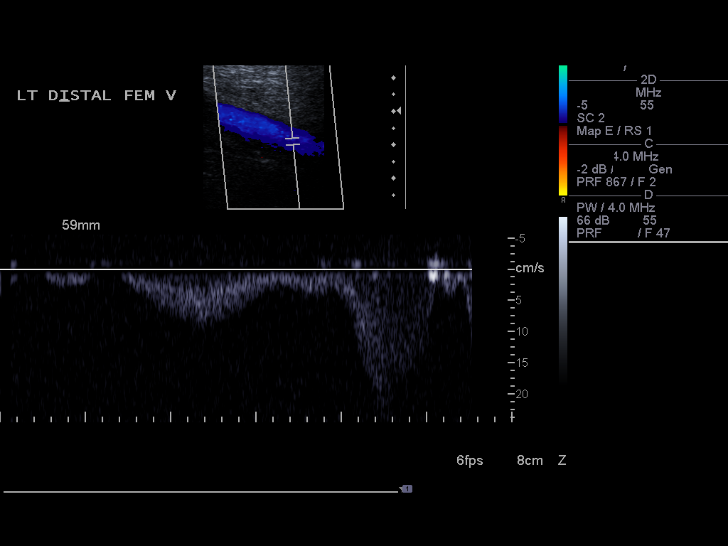
[im 24/29]
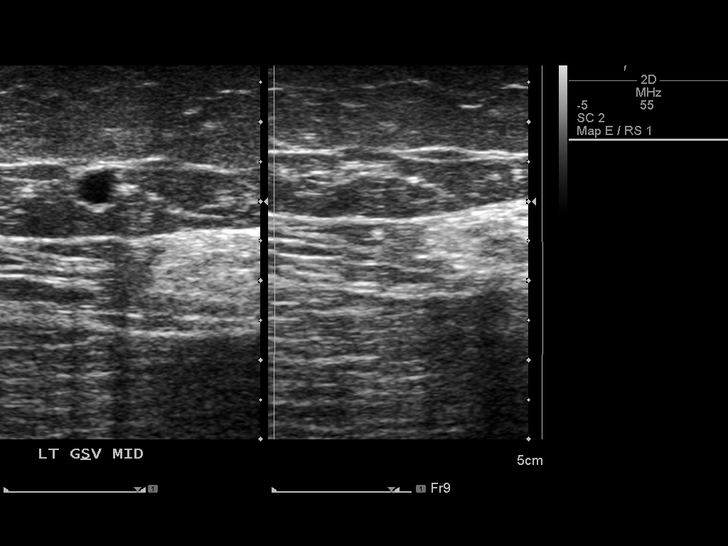
[im 26/29]
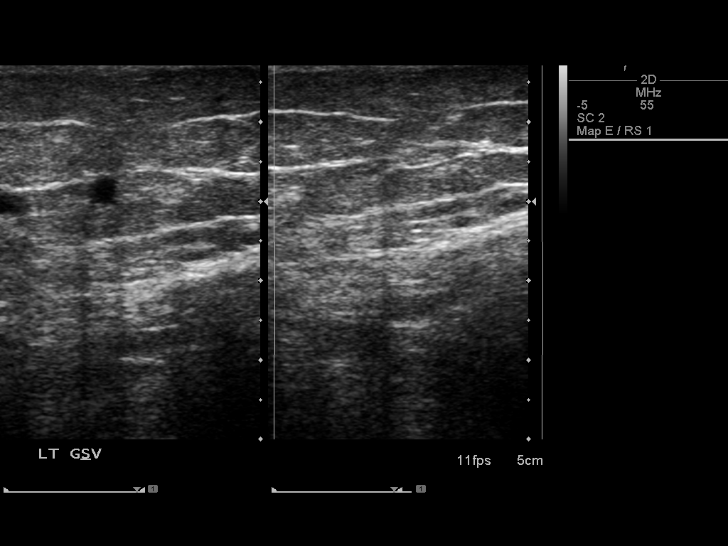
[im 29/29]
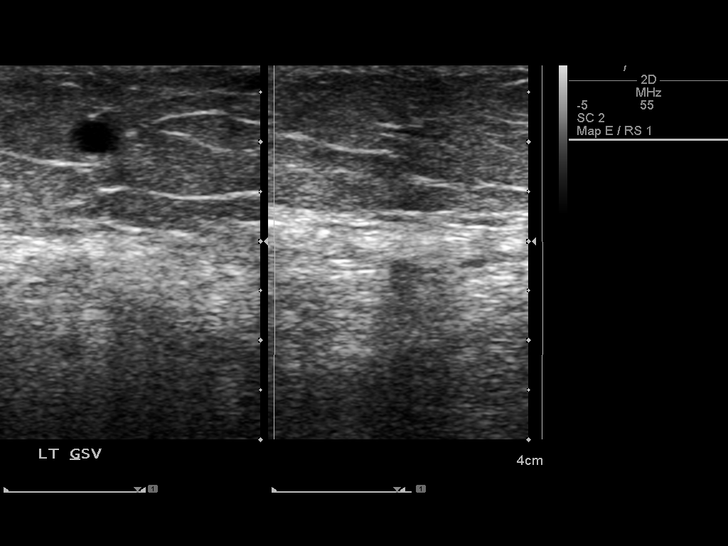

[13 of 24 positions shown; findings below may reference images not displayed]

FINDINGS: Contralateral Common Femoral Vein: Respiratory phasicity is normal
and symmetric with the symptomatic side. No evidence of thrombus.
Normal compressibility.

Common Femoral Vein: No evidence of thrombus. Normal
compressibility, respiratory phasicity and response to augmentation.

Saphenofemoral Junction: No evidence of thrombus. Normal
compressibility and flow on color Doppler imaging.

Profunda Femoral Vein: No evidence of thrombus. Normal
compressibility and flow on color Doppler imaging.

Femoral Vein: No evidence of thrombus. Normal compressibility,
respiratory phasicity and response to augmentation.

Popliteal Vein: No evidence of thrombus. Normal compressibility,
respiratory phasicity and response to augmentation.

Calf Veins: No evidence of thrombus. Normal compressibility and flow
on color Doppler imaging.

Superficial Great Saphenous Vein: No evidence of thrombus. Normal
compressibility and flow on color Doppler imaging.

Venous Reflux:  None.

Other Findings: Superficial edema in the subcutaneous soft tissues
of the mid calf.
IMPRESSION: No evidence of deep venous thrombosis.

## 2022-05-18 ENCOUNTER — Emergency Department (HOSPITAL_COMMUNITY): Payer: Medicare Other

## 2022-05-18 ENCOUNTER — Other Ambulatory Visit: Payer: Self-pay

## 2022-05-18 ENCOUNTER — Encounter (HOSPITAL_COMMUNITY): Payer: Self-pay

## 2022-05-18 ENCOUNTER — Emergency Department (HOSPITAL_COMMUNITY)
Admission: EM | Admit: 2022-05-18 | Discharge: 2022-05-18 | Disposition: A | Discharge status: Home / Self Care | Payer: Medicare Other | Attending: Emergency Medicine | Admitting: Emergency Medicine

## 2022-05-18 DIAGNOSIS — I129 Hypertensive chronic kidney disease with stage 1 through stage 4 chronic kidney disease, or unspecified chronic kidney disease: Secondary | ICD-10-CM | POA: Diagnosis not present

## 2022-05-18 DIAGNOSIS — I4510 Unspecified right bundle-branch block: Secondary | ICD-10-CM | POA: Diagnosis not present

## 2022-05-18 DIAGNOSIS — R0989 Other specified symptoms and signs involving the circulatory and respiratory systems: Secondary | ICD-10-CM | POA: Diagnosis not present

## 2022-05-18 DIAGNOSIS — D649 Anemia, unspecified: Secondary | ICD-10-CM | POA: Diagnosis not present

## 2022-05-18 DIAGNOSIS — R7989 Other specified abnormal findings of blood chemistry: Secondary | ICD-10-CM | POA: Insufficient documentation

## 2022-05-18 DIAGNOSIS — R0902 Hypoxemia: Secondary | ICD-10-CM | POA: Insufficient documentation

## 2022-05-18 DIAGNOSIS — Z86718 Personal history of other venous thrombosis and embolism: Secondary | ICD-10-CM | POA: Insufficient documentation

## 2022-05-18 DIAGNOSIS — R062 Wheezing: Secondary | ICD-10-CM | POA: Insufficient documentation

## 2022-05-18 DIAGNOSIS — G894 Chronic pain syndrome: Secondary | ICD-10-CM | POA: Insufficient documentation

## 2022-05-18 DIAGNOSIS — E039 Hypothyroidism, unspecified: Secondary | ICD-10-CM | POA: Insufficient documentation

## 2022-05-18 DIAGNOSIS — Z79899 Other long term (current) drug therapy: Secondary | ICD-10-CM | POA: Insufficient documentation

## 2022-05-18 DIAGNOSIS — N183 Chronic kidney disease, stage 3 unspecified: Secondary | ICD-10-CM | POA: Diagnosis not present

## 2022-05-18 LAB — CBC WITH DIFFERENTIAL/PLATELET
Abs Immature Granulocytes: 0.02 10*3/uL (ref 0.00–0.07)
Basophils Absolute: 0.1 10*3/uL (ref 0.0–0.1)
Basophils Relative: 1 %
Eosinophils Absolute: 0.3 10*3/uL (ref 0.0–0.5)
Eosinophils Relative: 4 %
HCT: 32.9 % — ABNORMAL LOW (ref 36.0–46.0)
Hemoglobin: 9.9 g/dL — ABNORMAL LOW (ref 12.0–15.0)
Immature Granulocytes: 0 %
Lymphocytes Relative: 14 %
Lymphs Abs: 1 10*3/uL (ref 0.7–4.0)
MCH: 26.6 pg (ref 26.0–34.0)
MCHC: 30.1 g/dL (ref 30.0–36.0)
MCV: 88.4 fL (ref 80.0–100.0)
Monocytes Absolute: 0.7 10*3/uL (ref 0.1–1.0)
Monocytes Relative: 10 %
Neutro Abs: 5.1 10*3/uL (ref 1.7–7.7)
Neutrophils Relative %: 71 %
Platelets: 255 10*3/uL (ref 150–400)
RBC: 3.72 MIL/uL — ABNORMAL LOW (ref 3.87–5.11)
RDW: 15.4 % (ref 11.5–15.5)
WBC: 7.2 10*3/uL (ref 4.0–10.5)
nRBC: 0 % (ref 0.0–0.2)

## 2022-05-18 LAB — I-STAT VENOUS BLOOD GAS, ED
Acid-Base Excess: 2 mmol/L (ref 0.0–2.0)
Bicarbonate: 27.1 mmol/L (ref 20.0–28.0)
Calcium, Ion: 1.14 mmol/L — ABNORMAL LOW (ref 1.15–1.40)
HCT: 31 % — ABNORMAL LOW (ref 36.0–46.0)
Hemoglobin: 10.5 g/dL — ABNORMAL LOW (ref 12.0–15.0)
O2 Saturation: 98 %
Potassium: 4.7 mmol/L (ref 3.5–5.1)
Sodium: 138 mmol/L (ref 135–145)
TCO2: 28 mmol/L (ref 22–32)
pCO2, Ven: 43.3 mmHg — ABNORMAL LOW (ref 44–60)
pH, Ven: 7.405 (ref 7.25–7.43)
pO2, Ven: 110 mmHg — ABNORMAL HIGH (ref 32–45)

## 2022-05-18 LAB — BASIC METABOLIC PANEL
Anion gap: 8 (ref 5–15)
BUN: 17 mg/dL (ref 8–23)
CO2: 25 mmol/L (ref 22–32)
Calcium: 9.9 mg/dL (ref 8.9–10.3)
Chloride: 106 mmol/L (ref 98–111)
Creatinine, Ser: 1.17 mg/dL — ABNORMAL HIGH (ref 0.44–1.00)
GFR, Estimated: 47 mL/min — ABNORMAL LOW (ref >60)
Glucose, Bld: 98 mg/dL (ref 70–99)
Potassium: 4.8 mmol/L (ref 3.5–5.1)
Sodium: 139 mmol/L (ref 135–145)

## 2022-05-18 LAB — BRAIN NATRIURETIC PEPTIDE: B Natriuretic Peptide: 21.6 pg/mL (ref 0.0–100.0)

## 2022-05-18 NOTE — ED Notes (Signed)
Got patient into a gown on the monitor patient is resting with family at bedside and call bell in reach 

## 2022-05-18 NOTE — Discharge Instructions (Signed)
You were seen in the ED today after having prolonged hypoxia following surgery. You have not had any evidence of hypoxia since you have been here. Your labs and chest x ray were all reassuring. Please follow up with your PCP for any further concerns.

## 2022-05-18 NOTE — ED Notes (Signed)
Per lab, patient's lab hemolyzed and needs to  be redrawn. This RN asked why the patient's labs are just now getting processed when they were collected at 1546. Lab tech reports that he just received them in although this RN spoke with Reya (see previous note) regarding getting the labs processed. This RN to update the provider at this time to notify them of the situation.

## 2022-05-18 NOTE — ED Triage Notes (Signed)
Per ems patient had a right wrist orthopedic repair earlier today. Per EMS surgical center called EMS because her 02 sats low 68 on room air. Orthopedic center placed patient on the non rebreather and patient 02 sats came up to mid 90s. Patient was monitored for a few hours at the surgical center without improvement.

## 2022-05-18 NOTE — ED Notes (Signed)
This RN spoke with Reya from the lab who reports that she will "receive the labs in now"

## 2022-05-18 NOTE — ED Notes (Signed)
This RN resent previously hemolyzed labs at this time (a purple and light green top)

## 2022-05-18 NOTE — ED Provider Notes (Signed)
[facility] EMERGENCY DEPARTMENT Provider Note   CSN: [id number] Arrival date & time: 05/18/22  1505     History PMH: Chronic Pain Syndrome on home 10 mg Oxycodone every 4 hours, CKD stage 3, HTN, history of DVT s/p IVC placement, hypothyrodism Recent right wrist fracture, underwent surgical repair on 05/18/22 Chief Complaint  Patient presents with   Post-op Problem    [name] is a 80 y.o. female. Presents as a transfer from [employer].  She sustained a right wrist fracture on August 3 after a fall.  She underwent Right wrist ORIF today. She received a supraclavicular block with 75 mcg Fentanyl, Propofol induction. She was placed under general anesthesia and was intubated. Procedure had no complications. In PACU, she was extubated and found to have prolonged hypoxia with saturations down to 68% on room air. She was placed on nonrebreather with return of saturations to mid 90s. She also received an albuterol inhaler for wheezing. She continued to have hypoxia after being monitored so she was transferred to our Emergency Department for further management.  She is here with her husband and both patient husband states she was in her normal state of health prior to the surgery.  She had taken her home oxycodone this morning.  She denies any shortness of breath, chest pain, nausea, vomiting, dizziness, or other abnormalities different from her baseline.  I did ask husband if patient is at her normal mental state, and he states that she is acting as she normally does. They deny any new leg swelling, fevers, or other symptoms  HPI     Home Medications Prior to Admission medications   Medication Sig Start Date End Date Taking? Authorizing Provider  alendronate (FOSAMAX) 70 MG tablet Take 70 mg by mouth every 7 (seven) days. Takes on Thursday Take with a full glass of water on an empty stomach.    [provider]  Calcium Carbonate-Vitamin D  (CALCIUM + D PO) Take 1 tablet by mouth daily.    [provider]  docusate sodium (COLACE) 100 MG capsule Take 100 mg by mouth daily.    [provider]  levothyroxine (SYNTHROID, LEVOTHROID) 88 MCG tablet Take 88 mcg by mouth daily.    [provider]  MAGNESIUM PO Take 1 tablet by mouth daily.    [provider]  omeprazole (PRILOSEC) 20 MG capsule Take 20 mg by mouth daily.    [provider]  oxyCODONE 10 MG TABS Take 1 tablet (10 mg total) by mouth every 4 (four) hours as needed. 10/02/12   Richarda Overlie, MD  polyethylene glycol (MIRALAX / GLYCOLAX) packet Take 17 g by mouth daily.    [provider]  pregabalin (LYRICA) 75 MG capsule Take 75 mg by mouth 4 (four) times daily.    [provider]  VITAMIN D, ERGOCALCIFEROL, PO Take 1 tablet by mouth daily.    [provider]      Allergies    Amoxicillin    Review of Systems   Review of Systems  Constitutional:  Negative for chills and fever.  Respiratory:  Negative for cough, chest tightness, shortness of breath and wheezing.   Cardiovascular:  Negative for chest pain and leg swelling.  Gastrointestinal:  Negative for abdominal pain, nausea and vomiting.  Neurological:  Negative for dizziness.  All other systems reviewed and are negative.   Physical Exam Updated Vital Signs BP (!) 188/82   Pulse 80   Temp 98.3  F (36.8 C) (Oral)   Resp 18   SpO2 98%  Physical Exam Vitals and nursing note reviewed.  Constitutional:      General: She is not in acute distress.    Appearance: Normal appearance. She is obese. She is not toxic-appearing or diaphoretic.     Comments: Chronically ill appearing  HENT:     Head: Normocephalic and atraumatic.     Nose: No nasal deformity.     Mouth/Throat:     Lips: Pink. No lesions.     Mouth: Mucous membranes are moist. No injury, lacerations, oral lesions or angioedema.     Pharynx: Oropharynx is clear. Uvula midline. No  pharyngeal swelling, oropharyngeal exudate, posterior oropharyngeal erythema or uvula swelling.     Comments: No tonsillar swelling or airway compromise on HENT exam Eyes:     General: Gaze aligned appropriately. No scleral icterus.       Right eye: No discharge.        Left eye: No discharge.     Conjunctiva/sclera: Conjunctivae normal.     Right eye: Right conjunctiva is not injected. No exudate or hemorrhage.    Left eye: Left conjunctiva is not injected. No exudate or hemorrhage.    Pupils: Pupils are equal, round, and reactive to light.  Neck:     Comments: No swelling of the neck. No bruits or stridor noted. Cardiovascular:     Rate and Rhythm: Normal rate and regular rhythm.     Pulses: Normal pulses.          Radial pulses are 2+ on the right side and 2+ on the left side.       Dorsalis pedis pulses are 2+ on the right side and 2+ on the left side.     Heart sounds: Normal heart sounds, S1 normal and S2 normal. Heart sounds not distant. No murmur heard.    No friction rub. No gallop. No S3 or S4 sounds.  Pulmonary:     Effort: Pulmonary effort is normal. No accessory muscle usage or respiratory distress.     Breath sounds: Normal breath sounds. No stridor. No wheezing, rhonchi or rales.  Chest:     Chest wall: No tenderness.  Abdominal:     General: Abdomen is flat. There is no distension.     Palpations: Abdomen is soft. There is no mass or pulsatile mass.     Tenderness: There is no abdominal tenderness. There is no guarding or rebound.  Musculoskeletal:     Right lower leg: No edema.     Left lower leg: No edema.     Comments: Right forearm cast. UTA radial pulse, but has normal sensation and capillary refill. Seems to be perfusing well  Skin:    General: Skin is warm and dry.     Coloration: Skin is not jaundiced or pale.     Findings: No bruising, erythema, lesion or rash.  Neurological:     General: No focal deficit present.     Mental Status: She is alert and  oriented to person, place, and time.     GCS: GCS eye subscore is 4. GCS verbal subscore is 5. GCS motor subscore is 6.  Psychiatric:        Mood and Affect: Mood normal.        Behavior: Behavior normal. Behavior is cooperative.     ED Results / Procedures / Treatments   Labs (all labs ordered are listed, but only abnormal results are displayed)  Labs Reviewed  CBC WITH DIFFERENTIAL/PLATELET - Abnormal; Notable for the following components:      Result Value   RBC 3.72 (*)    Hemoglobin 9.9 (*)    HCT 32.9 (*)    All other components within normal limits  BASIC METABOLIC PANEL - Abnormal; Notable for the following components:   Creatinine, Ser 1.17 (*)    GFR, Estimated 47 (*)    All other components within normal limits  I-STAT VENOUS BLOOD GAS, ED - Abnormal; Notable for the following components:   pCO2, Ven 43.3 (*)    pO2, Ven 110 (*)    Calcium, Ion 1.14 (*)    HCT 31.0 (*)    Hemoglobin 10.5 (*)    All other components within normal limits  BRAIN NATRIURETIC PEPTIDE  BLOOD GAS, VENOUS    EKG EKG Interpretation  Date/Time:  Wednesday May 18 2022 15:46:01 EDT Ventricular Rate:  57 PR Interval:  215 QRS Duration: 134 QT Interval:  449 QTC Calculation: 438 R Axis:   17 Text Interpretation: Sinus rhythm Borderline prolonged PR interval Right bundle branch block Confirmed by Regan Lemming ([id number]) on 05/18/2022 3:49:51 PM  Radiology DG Chest 2 View  Result Date: 05/18/2022 CLINICAL DATA:  81 year old female with shortness of breath. EXAM: CHEST - 2 VIEW COMPARISON:  10/02/2012 FINDINGS: The mediastinal contours are within normal limits. No cardiomegaly. Cephalization of the pulmonary vasculature. Bibasilar subsegmental atelectasis. No evidence of pleural effusion or pneumothorax. Exaggerated thoracic kyphosis. Diffuse osteopenia. IMPRESSION: 1. Pulmonary vascular congestion without evidence of overt pulmonary edema. 2. Bibasilar subsegmental atelectasis. 3. Exaggerated  thoracic kyphosis. Electronically Signed   By: Ruthann Cancer M.D.   On: 05/18/2022 16:25    Procedures Procedures  This patient was on telemetry or cardiac monitoring during their time in the ED.    Medications Ordered in ED Medications - No data to display  ED Course/ Medical Decision Making/ A&P                           Medical Decision Making Amount and/or Complexity of Data Reviewed Labs: ordered. Radiology: ordered.    MDM  This is a 81 y.o. female who presents to the ED with prolonged hypoxia following general anesthesia for a right wrist ORIF procedure today The differential of this patient includes but is not limited to PTX, PE, PNA, anaphylaxis, prolonged sedation, medication side effect.  Initial Impression  Patient is chronically ill-appearing and has no acute respiratory distress. She is oxygenating at 95% to 100% on room air.  She is afebrile and has stable vitals. She has equal chest rise bilaterally.  Clear lung sounds.  She does not appear to be hypervolemic on exam.  She does seem to be a little bit drowsy, per husband, this is her baseline. Initial labs ordered along with EKG and chest x-ray.  I personally ordered, reviewed, and interpreted all laboratory work and imaging and agree with radiologist interpretation. Results interpreted below: CBC with no leukocytosis, stable anemia BMP with minimally elevated from baseline creatinine  BNP normal VBG without acidosis or hypercarbia CXR with pulmonary vasculature congestion without pulmonary edema EKG NSR with RBBB  Assessment/Plan:  Patient is s/p ORIF of right wrist with prolonged hypoxia following anesthesia. Her hypoxia has resolved here and she is saturating well on RA. She has no acute respiratory distress or any subjective feelings of being unwell. Her husband reports that she is at her baseline mentation. Workup  reveals no acute findings.  I have low overall suspicion for anaphylaxis, PE, or PNA at this  time.  Patient has not had any episodes of hypoxia since being here.  I suspect hypoxia was related to opioid use with being given fentanyl intraprocedure and also taking her home oxycodone.  She is alert and oriented x3 and in no acute distress.  She has stable vitals.  I discussed findings with patient and her family.  I do not feel that she meets admission requirements at this time.  Return precautions provided.  Follow-up with PCP.     Charting Requirements Additional history is obtained from:  Independent historian External Records from outside source obtained and reviewed including: reviewed paperwork from surgery today Social Determinants of Health:  none Pertinant PMH that complicates patient's illness: recent surgery today  Patient Care Problems that were addressed during this visit: - Hypoxia: Acute illness with systemic symptoms This patient was maintained on a cardiac monitor/telemetry. I personally viewed and interpreted the cardiac monitor which reveals an underlying rhythm of NSR Medications given in ED: n/a Reevaluation of the patient after these medicines showed that the patient resolved I have reviewed home medications and made changes accordingly.  Critical Care Interventions: n/a Consultations: n/a Disposition: discharge  This is a shared visit with my attending physician, Dr. Armandina Gemma.  We have discussed this patient and they have independently evaluated this patient. The plan was altered or changed as needed.  Portions of this note were generated with Lobby[employer]. Dictation errors may occur despite best attempts at proofreading.    Final Clinical Impression(s) / ED Diagnoses Final diagnoses:  Hypoxia    Rx / DC Orders ED Discharge Orders     None         Adolphus Birchwood, PA-C 05/18/22 2137    Regan Lemming, MD 05/19/22 [phone]

## 2022-05-18 NOTE — ED Notes (Addendum)
Provider at bedside at this time patient currently 98% on room air patient denies [facility] or pain at this time. Patient alert and oriented

## 2023-04-05 ENCOUNTER — Other Ambulatory Visit: Payer: Self-pay | Admitting: Internal Medicine

## 2023-04-05 ENCOUNTER — Ambulatory Visit
Admission: RE | Admit: 2023-04-05 | Discharge: 2023-04-05 | Disposition: A | Discharge status: Home / Self Care | Payer: Medicare Other | Source: Ambulatory Visit

## 2023-04-05 DIAGNOSIS — Z1231 Encounter for screening mammogram for malignant neoplasm of breast: Secondary | ICD-10-CM

## 2023-11-07 NOTE — Progress Notes (Signed)
Boot

## 2024-06-14 ENCOUNTER — Non-Acute Institutional Stay (INDEPENDENT_AMBULATORY_CARE_PROVIDER_SITE_OTHER): Payer: Self-pay | Admitting: Sports Medicine

## 2024-06-14 ENCOUNTER — Encounter: Payer: Self-pay | Admitting: Sports Medicine

## 2024-06-14 VITALS — BP 122/84 | HR 65 | Temp 97.3°F | Ht 66.0 in

## 2024-06-14 DIAGNOSIS — G629 Polyneuropathy, unspecified: Secondary | ICD-10-CM | POA: Diagnosis not present

## 2024-06-14 DIAGNOSIS — I872 Venous insufficiency (chronic) (peripheral): Secondary | ICD-10-CM | POA: Diagnosis not present

## 2024-06-14 DIAGNOSIS — G8929 Other chronic pain: Secondary | ICD-10-CM | POA: Diagnosis not present

## 2024-06-14 DIAGNOSIS — I10 Essential (primary) hypertension: Secondary | ICD-10-CM

## 2024-06-14 NOTE — Progress Notes (Signed)
 Careteam: No care team member to display  PLACE OF SERVICE:  [facility]  Advanced Directive information    Allergies  Allergen Reactions   Penicillins Dermatitis and Other (See Comments)   Amoxicillin Rash   Tapentadol Nausea Only    Chief Complaint  Patient presents with   Establish Care    New Patient      Discussed the use of AI scribe software for clinical note transcription with the patient, who gave verbal consent to proceed.  History of Present Illness    [name] is an 83 year old female who presents for a transition of care to [facility]. She is accompanied by her husband.  She experiences chronic pain primarily in her back, right hip, and down her leg, describing it as constant. She manages the pain with morphine , both extended-release and immediate-release, typically twice a day, with the immediate-release used for severe pain. No falls have occurred, and she consistently uses a walker. Additionally, she takes duloxetine for pain management, which also aids her mood, and pregabalin  for pain.  She has a history of osteoporosis and takes Fosamax once a week. She supplements her diet with calcium  and vitamin D  daily. Although she does not engage in regular exercise, she has signed up for physical therapy to improve her strength and balance.  She experiences chronic venous stasis with two wounds on her right leg, one on the side and one near the ankle. She has been receiving wound care at [facility] in [place] and changes the dressing daily with her husband's assistance.  She takes Miralax  daily to manage constipation, a side effect of her pain medications, and reports regular bowel movements with its use. For blood pressure management, she is on Coreg and losartan and does not experience dizziness or lightheadedness. She also takes levothyroxine  for a thyroid  condition.  She experiences urinary incontinence and uses pads. No burning or blood in her  urine. Her mood is good, and she does not feel depressed. She has a supportive family, including a son in [place] and grandchildren in [place] and [place].  Review of Systems:  Review of Systems  Constitutional:  Negative for chills and fever.  Respiratory:  Negative for cough, sputum production and shortness of breath.   Cardiovascular:  Negative for chest pain, palpitations and leg swelling.  Gastrointestinal:  Negative for abdominal pain, heartburn and nausea.  Genitourinary:  Negative for dysuria, frequency and hematuria.  Musculoskeletal:  Positive for back pain. Negative for falls and myalgias.  Neurological:  Negative for dizziness.   Negative unless indicated in HPI.   Past Medical History:  Diagnosis Date   Chronic pain    DDD (degenerative disc disease), lumbar    History of DVT of lower extremity    Left Lower Extremity   Hypertension    Hypothyroidism    Osteoporosis    Past Surgical History:  Procedure Laterality Date   BACK SURGERY     S/P IVC Filter Placement     THROMBECTOMY W/ EMBOLECTOMY     Social History:   reports that she has never smoked. She does not have any smokeless tobacco history on file. She reports current alcohol use. No history on file for drug use.  Family History  Problem Relation Age of Onset   CAD Mother    Hypertension Mother    Diabetes Mother    Congestive Heart Failure Mother    CAD Father    Diabetes Father    Breast cancer Neg  Hx     Medications: Patient's Medications  New Prescriptions   No medications on file  Previous Medications   ACETAMINOPHEN  (TYLENOL ) 500 MG TABLET    Take 500 mg by mouth daily at 6 (six) AM.   ALENDRONATE (FOSAMAX) 70 MG TABLET    Take 70 mg by mouth every 7 (seven) days. Takes on Thursday Take with a full glass of water on an empty stomach.   BIOTIN PO    Take by mouth.   BISACODYL  (DULCOLAX) 5 MG EC TABLET    Take 5 mg by mouth once.   CALCIUM  CARB-CHOLECALCIFEROL (OYSCO 500 + D) 500-5  MG-MCG TABS    Take by mouth.   CALCIUM  CARB-CHOLECALCIFEROL (OYSTER SHELL CALCIUM  W/D) 500-5 MG-MCG TABS    Take 1 tablet by mouth.   CALCIUM  CARBONATE-VITAMIN D  (CALCIUM  + D PO)    Take 1 tablet by mouth daily.   CARVEDILOL (COREG) 6.25 MG TABLET    Take 6.25 mg by mouth daily.   DOCUSATE SODIUM  (COLACE) 100 MG CAPSULE    Take 100 mg by mouth daily.   LEVOTHYROXINE  (SYNTHROID , LEVOTHROID) 88 MCG TABLET    Take 88 mcg by mouth daily.   MAGNESIUM  PO    Take 1 tablet by mouth daily.   OMEPRAZOLE (PRILOSEC) 20 MG CAPSULE    Take 20 mg by mouth daily.   OXYCODONE  10 MG TABS    Take 1 tablet (10 mg total) by mouth every 4 (four) hours as needed.   POLYETHYLENE GLYCOL (MIRALAX  / GLYCOLAX ) PACKET    Take 17 g by mouth daily.   PREGABALIN  (LYRICA ) 75 MG CAPSULE    Take 75 mg by mouth 4 (four) times daily.   VITAMIN D , ERGOCALCIFEROL , PO    Take 1 tablet by mouth daily.  Modified Medications   No medications on file  Discontinued Medications   No medications on file    Physical Exam: Vitals:   06/14/24 0932  BP: 122/84  Pulse: 65  Temp: (!) 97.3 F (36.3 C)  SpO2: 92%  Height: 5' 6 (1.676 m)   Body mass index is 25.82 kg/m. BP Readings from Last 3 Encounters:  06/14/24 122/84  05/18/22 (!) 188/82  09/15/14 (!) 150/87   Wt Readings from Last 3 Encounters:  07/04/14 160 lb (72.6 kg)  09/30/12 158 lb (71.7 kg)    Physical Exam Constitutional:      Appearance: Normal appearance.  HENT:     Head: Normocephalic and atraumatic.  Cardiovascular:     Rate and Rhythm: Normal rate and regular rhythm.  Pulmonary:     Effort: Pulmonary effort is normal. No respiratory distress.     Breath sounds: Normal breath sounds. No wheezing.  Abdominal:     General: Bowel sounds are normal. There is no distension.     Tenderness: There is no abdominal tenderness. There is no guarding or rebound.     Comments:    Musculoskeletal:        General: No swelling.  Skin:    General: Skin is dry.   Neurological:     Mental Status: She is alert. Mental status is at baseline.     Motor: No weakness.     Labs reviewed: Basic Metabolic Panel: No results for input(s): NA, K, CL, CO2, GLUCOSE, BUN, CREATININE, CALCIUM , MG, PHOS, TSH in the last 8760 hours. Liver Function Tests: No results for input(s): AST, ALT, ALKPHOS, BILITOT, PROT, ALBUMIN in the last 8760 hours. No results for input(s):  LIPASE, AMYLASE in the last 8760 hours. No results for input(s): AMMONIA in the last 8760 hours. CBC: No results for input(s): WBC, NEUTROABS, HGB, HCT, MCV, PLT in the last 8760 hours. Lipid Panel: No results for input(s): CHOL, HDL, LDLCALC, TRIG, CHOLHDL, LDLDIRECT in the last 8760 hours. TSH: No results for input(s): TSH in the last 8760 hours. A1C: No results found for: HGBA1C  Assessment and Plan Assessment & Plan  1. Venous stasis dermatitis of lower extremity (Primary) Chronic wound Husband does daily dressing changes Follows with wound clinic    2. Primary hypertension At goal  Cont with the same  3. Other chronic pain Pt follows with pain management Instructed patient to do home exercises  4. Neuropathy Cont with cymbalta      Other orders - acetaminophen  (TYLENOL ) 500 MG tablet; Take 500 mg by mouth daily at 6 (six) AM. (Patient taking differently: Take 500 mg by mouth every 6 (six) hours as needed for moderate pain (pain score 4-6). Take 2 tables as needed every 6 hours) - bisacodyl  (DULCOLAX) 5 MG EC tablet; Take 5 mg by mouth daily at 6 (six) AM. - Calcium  Carb-Cholecalciferol (OYSCO 500 + D) 500-5 MG-MCG TABS; Take by mouth. (Patient not taking: Reported on 06/14/2024) - Calcium  Carb-Cholecalciferol (OYSTER SHELL CALCIUM  W/D) 500-5 MG-MCG TABS; Take 1 tablet by mouth. (Patient not taking: Reported on 06/14/2024) - carvedilol (COREG) 6.25 MG tablet; Take 6.25 mg by mouth daily. - BIOTIN PO; Take by  mouth. - DULoxetine (CYMBALTA) 30 MG capsule; Take 30 mg by mouth daily. - levothyroxine  (SYNTHROID ) 75 MCG tablet; Take 75 mcg by mouth daily. - losartan (COZAAR) 100 MG tablet; Take 100 mg by mouth daily. - Multiple Vitamin (MULTIVITAMIN ADULT PO); Take by mouth every other day. - pregabalin  (LYRICA ) 50 MG capsule; Take 50 mg by mouth 3 (three) times daily.

## 2024-09-11 ENCOUNTER — Encounter (HOSPITAL_BASED_OUTPATIENT_CLINIC_OR_DEPARTMENT_OTHER): Payer: Self-pay | Admitting: Emergency Medicine

## 2024-09-11 ENCOUNTER — Other Ambulatory Visit: Payer: Self-pay

## 2024-09-11 ENCOUNTER — Emergency Department (HOSPITAL_BASED_OUTPATIENT_CLINIC_OR_DEPARTMENT_OTHER)
Admission: EM | Admit: 2024-09-11 | Discharge: 2024-09-11 | Disposition: A | Discharge status: Home / Self Care | Attending: Emergency Medicine | Admitting: Emergency Medicine

## 2024-09-11 ENCOUNTER — Emergency Department (HOSPITAL_BASED_OUTPATIENT_CLINIC_OR_DEPARTMENT_OTHER)

## 2024-09-11 DIAGNOSIS — W1811XA Fall from or off toilet without subsequent striking against object, initial encounter: Secondary | ICD-10-CM

## 2024-09-11 DIAGNOSIS — S0003XA Contusion of scalp, initial encounter: Secondary | ICD-10-CM | POA: Diagnosis not present

## 2024-09-11 DIAGNOSIS — S0990XA Unspecified injury of head, initial encounter: Secondary | ICD-10-CM | POA: Diagnosis present

## 2024-09-11 DIAGNOSIS — W1812XA Fall from or off toilet with subsequent striking against object, initial encounter: Secondary | ICD-10-CM | POA: Insufficient documentation

## 2024-09-11 NOTE — ED Notes (Signed)
 Helped patient to restroom

## 2024-09-11 NOTE — ED Triage Notes (Signed)
 Fall forward while sitting on toilet. May have fallen asleep on commode. Denies dizziness Hit forehead Swelling and hematoma formation ( 'goose-egg) Denies LOC Happened around 3pm Denies blood thinners

## 2024-09-11 NOTE — Discharge Instructions (Signed)
 Follow up with your family doc in the office   Please return for sudden worsening headache confusion or vomiting.

## 2024-09-11 NOTE — ED Notes (Signed)
 Reviewed AVS/discharge instruction with patient. Time allotted for and all questions answered. Patient is agreeable for d/c and escorted to ed exit by staff.

## 2024-09-11 NOTE — ED Provider Notes (Signed)
 [place] EMERGENCY DEPARTMENT AT [facility] Provider Note   CSN: [id number] Arrival date & time: 09/11/24  1542     Patient presents with: [name] is a 83 y.o. female.   83 yo F with a chief complaints of fall.  Patient was on the toilet and she was playing some games on her tablet send she fell asleep and woke up on the ground.  Complaining of pain to the front of her head.  Denies any other specific injury.  Denies chest pain back pain abdominal pain extremity pain.   Fall       Prior to Admission medications   Medication Sig Start Date End Date Taking? Authorizing Provider  acetaminophen  (TYLENOL ) 500 MG tablet Take 500 mg by mouth daily at 6 (six) AM. Patient taking differently: Take 500 mg by mouth every 6 (six) hours as needed for moderate pain (pain score 4-6). Take 2 tables as needed every 6 hours    [provider]  BIOTIN PO Take by mouth.    [provider]  bisacodyl  (DULCOLAX) 5 MG EC tablet Take 5 mg by mouth daily at 6 (six) AM. 01/24/22   [provider]  Calcium  Carbonate-Vitamin D  (CALCIUM  + D PO) Take 1 tablet by mouth daily.    [provider]  carvedilol (COREG) 6.25 MG tablet Take 6.25 mg by mouth daily. 12/22/22   [provider]  cyanocobalamin (VITAMIN B12) 1000 MCG tablet Take 1,000 mcg by mouth daily.    [provider]  docusate sodium  (COLACE) 100 MG capsule Take 100 mg by mouth daily. Patient taking differently: Take 100 mg by mouth 2 (two) times daily as needed for mild constipation.    [provider]  DULoxetine (CYMBALTA) 30 MG capsule Take 30 mg by mouth daily. 04/26/22   [provider]  levothyroxine  (SYNTHROID ) 75 MCG tablet Take 75 mcg by mouth daily.    [provider]  losartan (COZAAR) 100 MG tablet Take 100 mg by mouth daily.    [provider]  MAGNESIUM  PO Take 1 tablet by mouth daily. Patient not taking: Reported on  06/14/2024    [provider]  morphine  (MSIR) 15 MG tablet Take 15 mg by mouth. Extended Release twice a day and immediate release as needed at night time    [provider]  Multiple Vitamin (MULTIVITAMIN ADULT PO) Take by mouth every other day.    [provider]  naloxone [facility]) nasal spray 4 mg/0.1 mL Place 1 spray into the nose once. 11/29/23   [provider]  omeprazole (PRILOSEC) 20 MG capsule Take 20 mg by mouth daily. Patient not taking: Reported on 06/14/2024    [provider]  polyethylene glycol (MIRALAX  / GLYCOLAX ) packet Take 17 g by mouth daily. Patient taking differently: Take 17 g by mouth daily as needed for mild constipation or moderate constipation.    [provider]  pregabalin  (LYRICA ) 50 MG capsule Take 50 mg by mouth 3 (three) times daily.    [provider]  silver sulfADIAZINE (SILVADENE) 1 % cream Apply 1 Application topically as needed. 01/22/19   [provider]  VITAMIN D , ERGOCALCIFEROL , PO Take 1 tablet by mouth daily. Patient not taking: Reported on 06/14/2024    [provider]    Allergies: Penicillins, Amoxicillin, and Tapentadol    Review of Systems  Updated Vital Signs BP (!) 165/86   Pulse 68   Temp 98.5 F (  36.9 C)   Resp 20   SpO2 92%   Physical Exam Vitals and nursing note reviewed.  Constitutional:      General: She is not in acute distress.    Appearance: She is well-developed. She is not diaphoretic.  HENT:     Head: Normocephalic.     Comments: Right frontal hematoma Eyes:     Pupils: Pupils are equal, round, and reactive to light.  Cardiovascular:     Rate and Rhythm: Normal rate and regular rhythm.     Heart sounds: No murmur heard.    No friction rub. No gallop.  Pulmonary:     Effort: Pulmonary effort is normal.     Breath sounds: No wheezing or rales.  Abdominal:     General: There is no distension.     Palpations: Abdomen is soft.      Tenderness: There is no abdominal tenderness.  Musculoskeletal:        General: No tenderness.     Cervical back: Normal range of motion and neck supple.     Comments: Palpated from head to toe without any noted areas of bony tenderness.  No midline C-spine tenderness able to rotate her head 45 degrees in either direction without pain.  Skin:    General: Skin is warm and dry.  Neurological:     Mental Status: She is alert and oriented to person, place, and time.  Psychiatric:        Behavior: Behavior normal.     (all labs ordered are listed, but only abnormal results are displayed) Labs Reviewed - No data to display  EKG: None  Radiology: CT Cervical Spine Wo Contrast Result Date: 09/11/2024 EXAM: CT CERVICAL SPINE WITHOUT CONTRAST 09/11/2024 04:22:07 PM TECHNIQUE: CT of the cervical spine was performed without the administration of intravenous contrast. Multiplanar reformatted images are provided for review. Automated exposure control, iterative reconstruction, and/or weight based adjustment of the mA/kV was utilized to reduce the radiation dose to as low as reasonably achievable. COMPARISON: None available. CLINICAL HISTORY: Facial trauma, blunt. FINDINGS: CERVICAL SPINE: BONES AND ALIGNMENT: Straightening and slight reversal of the normal cervical lordosis. Trace degenerative anterolisthesis of C3 on C4 and C4 on C5. Additional trace degenerative retrolisthesis of C5 on C6. No evidence of traumatic malalignment. No acute fracture. DEGENERATIVE CHANGES: Disc space narrowing at multiple levels, most pronounced at C4-C5 through C6-C7. Facet arthrosis at multiple levels. No uncovertebral hypertrophy at multiple levels. Foraminal stenosis noted at multiple levels, most pronounced on the right at C4-C5 and bilaterally at C5-C6. No evidence of high grade osseous spinal canal stenosis. SOFT TISSUES: No prevertebral soft tissue swelling. Post surgical changes of right hemithyroidectomy. Biapical  parenchymal scarring. IMPRESSION: 1. Study is slightly limited by motion. Within these limitations, no acute abnormality of the cervical spine. 2. Degenerative changes as above. Electronically signed by: Donnice Mania MD 09/11/2024 04:56 PM EST RP Workstation: [id number]   CT Head Wo Contrast Result Date: 09/11/2024 EXAM: CT HEAD WITHOUT 09/11/2024 04:22:07 PM TECHNIQUE: CT of the head was performed without the administration of intravenous contrast. Automated exposure control, iterative reconstruction, and/or weight based adjustment of the mA/kV was utilized to reduce the radiation dose to as low as reasonably achievable. COMPARISON: None available. CLINICAL HISTORY: Facial trauma, blunt FINDINGS: BRAIN AND VENTRICLES: No acute intracranial hemorrhage. No mass effect or midline shift. No extra-axial fluid collection. No evidence of acute infarct. No hydrocephalus. Proportional prominence of ventricles and sulci, consistent with diffuse cerebral parenchymal volume loss.  Scattered periventricular white matter hypoattenuation, consistent with mild chronic ischemic microvascular disease. Calcified atherosclerotic plaque within cavernous/supraclinoid ICA and intradural vertebral arteries. ORBITS: No acute abnormality. SINUSES AND MASTOIDS: Complete opacification of sphenoid sinuses. Mild ethmoid sinus mucosal thickening. SOFT TISSUES AND SKULL: No acute skull fracture. Scalp soft tissue swelling along midline vertex. IMPRESSION: 1. No acute intracranial abnormality. 2. Scalp soft tissue swelling along the midline vertex. Additional mild soft tissue swelling in the midline frontal scalp. 3. Chronic bilateral sphenoid sinusitis. Electronically signed by: Donnice Mania MD 09/11/2024 04:49 PM EST RP Workstation: [id number]     Procedures   Medications Ordered in the ED - No data to display                                  Medical Decision Making Amount and/or Complexity of Data Reviewed Radiology:  ordered.   83 yo F with a cc of a fall.  Patient apparently likes to play video games when she is sitting on the toilet and she fell asleep and she fell and struck her head.  CT head and C-spine are negative.  She denies other injury.  Normally walks with a walker and was able to ambulate here with her own walker without issue.  Will discharge home.  PCP follow-up.  5:55 PM:  I have discussed the diagnosis/risks/treatment options with the patient and family.  Evaluation and diagnostic testing in the emergency department does not suggest an emergent condition requiring admission or immediate intervention beyond what has been performed at this time.  They will follow up with PCP. We also discussed returning to the ED immediately if new or worsening sx occur. We discussed the sx which are most concerning (e.g., sudden worsening pain, fever, inability to tolerate by mouth) that necessitate immediate return. Medications administered to the patient during their visit and any new prescriptions provided to the patient are listed below.  Medications given during this visit Medications - No data to display   The patient appears reasonably screen and/or stabilized for discharge and I doubt any other medical condition or other [facility] requiring further screening, evaluation, or treatment in the ED at this time prior to discharge.       Final diagnoses:  Fall from toilet seat, initial encounter  Hematoma of frontal scalp, initial encounter    ED Discharge Orders     None          Emil Share, DO 09/11/24 1755

## 2024-09-12 ENCOUNTER — Non-Acute Institutional Stay: Payer: Self-pay | Admitting: Nurse Practitioner

## 2024-09-12 ENCOUNTER — Encounter: Payer: Self-pay | Admitting: Nurse Practitioner

## 2024-09-12 VITALS — BP 126/78 | HR 70 | Temp 98.4°F | Ht 66.0 in | Wt 178.0 lb

## 2024-09-12 DIAGNOSIS — I872 Venous insufficiency (chronic) (peripheral): Secondary | ICD-10-CM | POA: Insufficient documentation

## 2024-09-12 DIAGNOSIS — I10 Essential (primary) hypertension: Secondary | ICD-10-CM

## 2024-09-12 DIAGNOSIS — F339 Major depressive disorder, recurrent, unspecified: Secondary | ICD-10-CM | POA: Insufficient documentation

## 2024-09-12 DIAGNOSIS — N1831 Chronic kidney disease, stage 3a: Secondary | ICD-10-CM

## 2024-09-12 DIAGNOSIS — M51369 Other intervertebral disc degeneration, lumbar region without mention of lumbar back pain or lower extremity pain: Secondary | ICD-10-CM

## 2024-09-12 DIAGNOSIS — N183 Chronic kidney disease, stage 3 unspecified: Secondary | ICD-10-CM | POA: Insufficient documentation

## 2024-09-12 DIAGNOSIS — E039 Hypothyroidism, unspecified: Secondary | ICD-10-CM | POA: Insufficient documentation

## 2024-09-12 DIAGNOSIS — E538 Deficiency of other specified B group vitamins: Secondary | ICD-10-CM | POA: Insufficient documentation

## 2024-09-12 NOTE — Assessment & Plan Note (Signed)
 Bun/creat 17/1.17 05/18/24

## 2024-09-12 NOTE — Progress Notes (Unsigned)
 Location:   Clinic [facility]   Place of Service:  Clinic (12) Provider: [facility] Shenee Wignall NP  Lizmary Nader X, NP  Patient Care Team: Sasuke Yaffe X, NP as PCP - General (Internal Medicine)  Extended Emergency Contact Information Primary Emergency Contact: Propes,Paul Address: [address]          [place], [place] [address] [place] Home Phone: [phone] Relation: Spouse  Code Status:  DNR Goals of care: Advanced Directive information    09/11/2024    3:49 PM  Advanced Directives  Does Patient Have a Medical Advance Directive? No  Would patient like information on creating a medical advance directive? No - Patient declined     Chief Complaint  Patient presents with   Medical Management of Chronic Issues    3 month follow-up. Discussed need for annual wellness visit (scheduled), bone density, and second shingrix vaccine. Here with husband    HPI:  Pt is a 83 y.o. female seen today for medical management of chronic diseases.    ED eval 09/11/24 for fall from toilet seat, resulted frontal scalp hematoma, no focal neurological symptoms, CT head/cervical spine unremarkable.   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   Hypothyroidism, taking levothyroxine   HTN, taking Losartan, Carvedilol   CKD Bun/creat 17/1.17 05/18/24        Past Medical History:  Diagnosis Date   Chronic pain    DDD (degenerative disc disease), lumbar    History of DVT of lower extremity    Left Lower Extremity   Hypertension    Hypothyroidism    Osteoporosis    Past Surgical History:  Procedure Laterality Date   BACK SURGERY     S/P IVC Filter Placement     THROMBECTOMY W/ EMBOLECTOMY      Allergies[1]  Allergies as of 09/12/2024        Reactions   Penicillins Dermatitis, Other (See Comments)   Amoxicillin Rash   Tapentadol Nausea Only        Medication List        Accurate as of September 12, 2024  3:24 PM. If you have any questions, ask your nurse or doctor.          acetaminophen  500 MG tablet Commonly known as: TYLENOL  Take 500 mg by mouth daily at 6 (six) AM.   BIOTIN PO Take 1 tablet by mouth daily.   bisacodyl  5 MG EC tablet Commonly known as: DULCOLAX Take 5 mg by mouth daily as needed.   CALCIUM  + D PO Take 1 tablet by mouth daily.   carvedilol 6.25 MG tablet Commonly known as: COREG Take 6.25 mg by mouth daily.   cyanocobalamin 1000 MCG tablet Commonly known as: VITAMIN B12 Take 1,000 mcg by mouth daily.   docusate sodium  100 MG capsule Commonly known as: COLACE Take 100 mg by mouth daily as needed.   DULoxetine 30 MG capsule Commonly known as: CYMBALTA Take 30 mg by mouth daily.   ferrous sulfate 325 (65 FE) MG EC tablet Take 325 mg by mouth daily.   levothyroxine  75 MCG tablet Commonly known as: SYNTHROID  Take 75 mcg by mouth daily.   losartan 100 MG tablet Commonly known as: COZAAR Take 100 mg by mouth daily.   MAGNESIUM   PO Take 1 tablet by mouth daily.   morphine  15 MG tablet Commonly known as: MSIR Take 15 mg by mouth daily as needed.   morphine  15 MG 12 hr tablet Commonly known as: MS CONTIN  Take 15 mg by mouth 3 (three) times daily.   MULTIVITAMIN ADULT PO Take 1 tablet by mouth as directed. Sporadically   naloxone 4 MG/0.1ML Liqd nasal spray kit Commonly known as: NARCAN Place 1 spray into the nose once.   omeprazole 20 MG capsule Commonly known as: PRILOSEC Take 20 mg by mouth as needed.   polyethylene glycol 17 g packet Commonly known as: MIRALAX  / GLYCOLAX  Take 17 g by mouth as needed.   pregabalin  50 MG capsule Commonly known as: LYRICA  Take 50 mg by mouth 3 (three) times daily.   silver sulfADIAZINE 1 % cream Commonly known as:  SILVADENE Apply 1 Application topically as needed.   VITAMIN D  (ERGOCALCIFEROL ) PO Take 1 tablet by mouth daily.        Review of Systems  Constitutional:  Positive for appetite change and fatigue. Negative for fever.  HENT:  Negative for trouble swallowing.   Eyes:  Negative for visual disturbance.  Respiratory:  Negative for cough and wheezing.   Gastrointestinal:  Negative for abdominal pain and constipation.  Genitourinary:  Negative for dysuria.  Musculoskeletal:  Positive for arthralgias, back pain and gait problem.  Skin:  Positive for wound.  Neurological:  Positive for weakness and numbness. Negative for syncope, facial asymmetry and headaches.       BLE numbness, weakness.   Psychiatric/Behavioral:  Positive for sleep disturbance. Negative for confusion. The patient is not nervous/anxious.     Immunization History  Administered Date(s) Administered   INFLUENZA, HIGH DOSE SEASONAL PF 07/21/2024   MODERNA COVID-19 SARS-COV-2 PEDS BIVALENT BOOSTER 15yr-48yr 01/05/2021   Moderna Covid-19 Vaccine Bivalent Booster 29yrs & up 06/10/2021   Moderna Sars-Covid-2 Vaccination 10/17/2019, 11/14/2019, 08/29/2020, 01/05/2021   PPD Test 01/29/2022, 01/29/2022, 02/07/2022, 02/07/2022   Pfizer(Comirnaty)Fall Seasonal Vaccine 12 years and older 07/21/2024   Pneumococcal Conjugate-13 09/24/2014   Pneumococcal Polysaccharide-23 10/03/2007   Tdap 01/24/2022   Zoster Recombinant(Shingrix) 05/04/2021   Pertinent  Health Maintenance Due  Topic Date Due   Bone Density Scan  Never done   Influenza Vaccine  Completed      05/18/2022    4:21 PM 06/14/2024    9:44 AM 06/14/2024   10:08 AM  Fall Risk  Falls in the past year?  0 0  Was there an injury with Fall?  0  1   Fall Risk Category Calculator  0 1  (RETIRED) Patient Fall Risk Level Low fall risk     Patient at Risk for Falls Due to  No Fall Risks   Fall risk Follow up  Falls evaluation completed      Data saved with a  previous flowsheet row definition   Functional Status Survey:    Vitals:   09/12/24 1110  BP: 126/78  Pulse: 70  Temp: 98.4 F (36.9 C)  SpO2: 94%  Weight: 178 lb (80.7 kg)  Height: 5' 6 (1.676 m)   Body mass index is 28.73 kg/m. Physical Exam Vitals and nursing note reviewed.  Constitutional:      Appearance: Normal appearance.  HENT:     Head: Normocephalic and atraumatic.     Nose: Nose normal.     Mouth/Throat:     Mouth: Mucous membranes are moist.  Eyes:     Extraocular Movements:  Extraocular movements intact.     Conjunctiva/sclera: Conjunctivae normal.     Pupils: Pupils are equal, round, and reactive to light.  Cardiovascular:     Rate and Rhythm: Normal rate and regular rhythm.     Heart sounds: No murmur heard. Pulmonary:     Effort: Pulmonary effort is normal.     Breath sounds: No wheezing, rhonchi or rales.  Abdominal:     General: Bowel sounds are normal.     Palpations: Abdomen is soft.     Tenderness: There is no abdominal tenderness.  Musculoskeletal:        General: No tenderness. Normal range of motion.     Cervical back: Normal range of motion and neck supple.     Right lower leg: No edema.     Left lower leg: No edema.  Skin:    General: Skin is warm and dry.     Findings: Erythema present.     Comments: Frontal hematoma Wound care center, LLE open wounds, wrapped in dressing.  Chronic venous insufficiency skin changes.   Neurological:     General: No focal deficit present.     Mental Status: She is alert and oriented to person, place, and time. Mental status is at baseline.     Motor: No weakness.     Coordination: Coordination normal.     Gait: Gait abnormal.  Psychiatric:        Mood and Affect: Mood normal.        Behavior: Behavior normal.        Thought Content: Thought content normal.        Judgment: Judgment normal.     Labs reviewed: No results for input(s): NA, K, CL, CO2, GLUCOSE, BUN, CREATININE,  CALCIUM , MG, PHOS in the last 8760 hours. No results for input(s): AST, ALT, ALKPHOS, BILITOT, PROT, ALBUMIN in the last 8760 hours. No results for input(s): WBC, NEUTROABS, HGB, HCT, MCV, PLT in the last 8760 hours. Lab Results  Component Value Date   TSH 0.697 ***Test methodology is 3rd generation TSH*** 10/17/2008   No results found for: HGBA1C No results found for: CHOL, HDL, LDLCALC, LDLDIRECT, TRIG, CHOLHDL  Significant Diagnostic Results in last 30 days:  CT Cervical Spine Wo Contrast Result Date: 09/11/2024 EXAM: CT CERVICAL SPINE WITHOUT CONTRAST 09/11/2024 04:22:07 PM TECHNIQUE: CT of the cervical spine was performed without the administration of intravenous contrast. Multiplanar reformatted images are provided for review. Automated exposure control, iterative reconstruction, and/or weight based adjustment of the mA/kV was utilized to reduce the radiation dose to as low as reasonably achievable. COMPARISON: None available. CLINICAL HISTORY: Facial trauma, blunt. FINDINGS: CERVICAL SPINE: BONES AND ALIGNMENT: Straightening and slight reversal of the normal cervical lordosis. Trace degenerative anterolisthesis of C3 on C4 and C4 on C5. Additional trace degenerative retrolisthesis of C5 on C6. No evidence of traumatic malalignment. No acute fracture. DEGENERATIVE CHANGES: Disc space narrowing at multiple levels, most pronounced at C4-C5 through C6-C7. Facet arthrosis at multiple levels. No uncovertebral hypertrophy at multiple levels. Foraminal stenosis noted at multiple levels, most pronounced on the right at C4-C5 and bilaterally at C5-C6. No evidence of high grade osseous spinal canal stenosis. SOFT TISSUES: No prevertebral soft tissue swelling. Post surgical changes of right hemithyroidectomy. Biapical parenchymal scarring. IMPRESSION: 1. Study is slightly limited by motion. Within these limitations, no acute abnormality of the cervical spine. 2.  Degenerative changes as above. Electronically signed by: Donnice Mania MD 09/11/2024 04:56 PM EST RP Workstation: [id number]  CT Head Wo Contrast Result Date: 09/11/2024 EXAM: CT HEAD WITHOUT 09/11/2024 04:22:07 PM TECHNIQUE: CT of the head was performed without the administration of intravenous contrast. Automated exposure control, iterative reconstruction, and/or weight based adjustment of the mA/kV was utilized to reduce the radiation dose to as low as reasonably achievable. COMPARISON: None available. CLINICAL HISTORY: Facial trauma, blunt FINDINGS: BRAIN AND VENTRICLES: No acute intracranial hemorrhage. No mass effect or midline shift. No extra-axial fluid collection. No evidence of acute infarct. No hydrocephalus. Proportional prominence of ventricles and sulci, consistent with diffuse cerebral parenchymal volume loss. Scattered periventricular white matter hypoattenuation, consistent with mild chronic ischemic microvascular disease. Calcified atherosclerotic plaque within cavernous/supraclinoid ICA and intradural vertebral arteries. ORBITS: No acute abnormality. SINUSES AND MASTOIDS: Complete opacification of sphenoid sinuses. Mild ethmoid sinus mucosal thickening. SOFT TISSUES AND SKULL: No acute skull fracture. Scalp soft tissue swelling along midline vertex. IMPRESSION: 1. No acute intracranial abnormality. 2. Scalp soft tissue swelling along the midline vertex. Additional mild soft tissue swelling in the midline frontal scalp. 3. Chronic bilateral sphenoid sinusitis. Electronically signed by: Donnice Mania MD 09/11/2024 04:49 PM EST RP Workstation: [id number]    Assessment/Plan  Fall from other slipping, tripping, or stumbling Will update CBC/diff, CMP/eGFR, TSH, lipids, Hgb A1c, Vit D, Vit B12  Hypertension Blood pressure is controlled Continue  taking Losartan, Carvedilol   CKD (chronic kidney disease) stage 3, GFR 30-59 ml/min (HCC) Bun/creat 17/1.17 05/18/24     Hypothyroidism   taking levothyroxine  Update TSH/freeT4  DDD (degenerative disc disease), lumbar taking Morphine , prn Naloxone available to her Peripheral neuropathy, on Pregabalin   Recurrent depression Continue Doluxetine  Vitamin B12 deficiency on Vit B12 supplement, Hgb 11.2 05/14/24 Update CBC/diff, Vit B12  Edema of both lower legs due to peripheral venous insufficiency Wound care center, LLE open wounds, wrapped in dressing.      Family/ staff Communication: plan of care reviewed with the patient  Labs/tests ordered:  CBC/diff, CMP/eGFR, TSH/free T4, lipids, Hgb A1c, Vit B12, Vit D        [1] Allergies Allergen Reactions   Penicillins Dermatitis and Other (See Comments)   Amoxicillin Rash   Tapentadol Nausea Only

## 2024-09-12 NOTE — Assessment & Plan Note (Signed)
 Blood pressure is controlled Continue  taking Losartan, Carvedilol

## 2024-09-12 NOTE — Assessment & Plan Note (Signed)
 Continue Doluxetine

## 2024-09-12 NOTE — Assessment & Plan Note (Signed)
 taking Morphine , prn Naloxone available to her Peripheral neuropathy, on Pregabalin 

## 2024-09-12 NOTE — Assessment & Plan Note (Signed)
 Wound care center, LLE open wounds, wrapped in dressing.

## 2024-09-12 NOTE — Assessment & Plan Note (Signed)
 on Vit B12 supplement, Hgb 11.2 05/14/24 Update CBC/diff, Vit B12

## 2024-09-12 NOTE — Patient Instructions (Addendum)
 1.) Visit [facility] to receive your shingrix vaccine  2.) Bone Density ordered, you can expect a call to schedule   3) f/u in clinic [facility] 6 months, labs prior on 03/04/25 @ 7 am

## 2024-09-12 NOTE — Assessment & Plan Note (Signed)
 taking levothyroxine  Update TSH/freeT4

## 2024-09-12 NOTE — Assessment & Plan Note (Signed)
 Will update CBC/diff, CMP/eGFR, TSH, lipids, Hgb A1c, Vit D, Vit B12

## 2024-09-13 ENCOUNTER — Encounter: Payer: Self-pay | Admitting: Nurse Practitioner

## 2024-11-21 ENCOUNTER — Ambulatory Visit: Admitting: Nurse Practitioner

## 2025-03-06 ENCOUNTER — Encounter: Admitting: Nurse Practitioner
